# Patient Record
Sex: Female | Born: 2000 | Race: Black or African American | Hispanic: No | Marital: Single | Smoking: Never smoker
Health system: Southern US, Community
[De-identification: ages and names within clinical notes are randomized; demographics above are authoritative.]

## PROBLEM LIST (undated history)

## (undated) DIAGNOSIS — D649 Anemia, unspecified: Secondary | ICD-10-CM

## (undated) HISTORY — PX: NO PAST SURGERIES: SHX2092

---
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
## 2019-08-15 DIAGNOSIS — Z419 Encounter for procedure for purposes other than remedying health state, unspecified: Secondary | ICD-10-CM | POA: Diagnosis not present

## 2019-09-15 DIAGNOSIS — Z419 Encounter for procedure for purposes other than remedying health state, unspecified: Secondary | ICD-10-CM | POA: Diagnosis not present

## 2019-10-16 DIAGNOSIS — Z419 Encounter for procedure for purposes other than remedying health state, unspecified: Secondary | ICD-10-CM | POA: Diagnosis not present

## 2019-11-15 DIAGNOSIS — Z419 Encounter for procedure for purposes other than remedying health state, unspecified: Secondary | ICD-10-CM | POA: Diagnosis not present

## 2019-12-16 DIAGNOSIS — Z419 Encounter for procedure for purposes other than remedying health state, unspecified: Secondary | ICD-10-CM | POA: Diagnosis not present

## 2019-12-17 ENCOUNTER — Emergency Department
Admission: EM | Admit: 2019-12-17 | Discharge: 2019-12-17 | Disposition: A | Discharge status: Home / Self Care | Payer: Medicaid Other | Attending: Emergency Medicine | Admitting: Emergency Medicine

## 2019-12-17 ENCOUNTER — Other Ambulatory Visit: Payer: Self-pay

## 2019-12-17 ENCOUNTER — Emergency Department: Payer: Medicaid Other

## 2019-12-17 DIAGNOSIS — B9689 Other specified bacterial agents as the cause of diseases classified elsewhere: Secondary | ICD-10-CM | POA: Insufficient documentation

## 2019-12-17 DIAGNOSIS — R1033 Periumbilical pain: Secondary | ICD-10-CM | POA: Insufficient documentation

## 2019-12-17 DIAGNOSIS — N3001 Acute cystitis with hematuria: Secondary | ICD-10-CM | POA: Diagnosis not present

## 2019-12-17 DIAGNOSIS — R509 Fever, unspecified: Secondary | ICD-10-CM | POA: Diagnosis not present

## 2019-12-17 DIAGNOSIS — D509 Iron deficiency anemia, unspecified: Secondary | ICD-10-CM | POA: Insufficient documentation

## 2019-12-17 DIAGNOSIS — E876 Hypokalemia: Secondary | ICD-10-CM | POA: Diagnosis not present

## 2019-12-17 DIAGNOSIS — E860 Dehydration: Secondary | ICD-10-CM | POA: Diagnosis not present

## 2019-12-17 DIAGNOSIS — R000 Tachycardia, unspecified: Secondary | ICD-10-CM | POA: Diagnosis not present

## 2019-12-17 DIAGNOSIS — R109 Unspecified abdominal pain: Secondary | ICD-10-CM | POA: Diagnosis not present

## 2019-12-17 LAB — COMPREHENSIVE METABOLIC PANEL
ALT: 11 U/L (ref 0–44)
AST: 17 U/L (ref 15–41)
Albumin: 4.1 g/dL (ref 3.5–5.0)
Alkaline Phosphatase: 53 U/L (ref 38–126)
Anion gap: 10 (ref 5–15)
BUN: 10 mg/dL (ref 6–20)
CO2: 21 mmol/L — ABNORMAL LOW (ref 22–32)
Calcium: 9.1 mg/dL (ref 8.9–10.3)
Chloride: 106 mmol/L (ref 98–111)
Creatinine, Ser: 0.84 mg/dL (ref 0.44–1.00)
GFR, Estimated: >60 mL/min (ref >60)
Glucose, Bld: 106 mg/dL — ABNORMAL HIGH (ref 70–99)
Potassium: 3 mmol/L — ABNORMAL LOW (ref 3.5–5.1)
Sodium: 137 mmol/L (ref 135–145)
Total Bilirubin: 1 mg/dL (ref 0.3–1.2)
Total Protein: 7.7 g/dL (ref 6.5–8.1)

## 2019-12-17 LAB — LIPASE, BLOOD: Lipase: 37 U/L (ref 11–51)

## 2019-12-17 LAB — URINALYSIS, COMPLETE (UACMP) WITH MICROSCOPIC
Bacteria, UA: NONE SEEN
Bilirubin Urine: NEGATIVE
Glucose, UA: NEGATIVE mg/dL
Ketones, ur: 80 mg/dL — AB
Nitrite: NEGATIVE
Protein, ur: 30 mg/dL — AB
Specific Gravity, Urine: >1.046 — ABNORMAL HIGH (ref 1.005–1.030)
WBC, UA: >50 WBC/hpf — ABNORMAL HIGH (ref 0–5)
pH: 5 (ref 5.0–8.0)

## 2019-12-17 LAB — CBC
HCT: 28 % — ABNORMAL LOW (ref 36.0–46.0)
Hemoglobin: 8.5 g/dL — ABNORMAL LOW (ref 12.0–15.0)
MCH: 21.4 pg — ABNORMAL LOW (ref 26.0–34.0)
MCHC: 30.4 g/dL (ref 30.0–36.0)
MCV: 70.5 fL — ABNORMAL LOW (ref 80.0–100.0)
Platelets: 399 10*3/uL (ref 150–400)
RBC: 3.97 MIL/uL (ref 3.87–5.11)
RDW: 17.2 % — ABNORMAL HIGH (ref 11.5–15.5)
WBC: 15.1 10*3/uL — ABNORMAL HIGH (ref 4.0–10.5)
nRBC: 0 % (ref 0.0–0.2)

## 2019-12-17 LAB — POC URINE PREG, ED: Preg Test, Ur: NEGATIVE

## 2019-12-17 MED ORDER — CEPHALEXIN 500 MG PO CAPS: 500.0000 mg | ORAL_CAPSULE | Freq: Four times a day (QID) | ORAL | 0 refills | Status: DC

## 2019-12-17 MED ORDER — LACTATED RINGERS IV BOLUS
1000.0000 mL | Freq: Once | INTRAVENOUS | Status: AC
  Administered 2019-12-17: 1000 mL via INTRAVENOUS

## 2019-12-17 MED ORDER — POTASSIUM CHLORIDE CRYS ER 20 MEQ PO TBCR
40.0000 meq | EXTENDED_RELEASE_TABLET | Freq: Once | ORAL | Status: AC
  Administered 2019-12-17: 40 meq via ORAL
  Filled 2019-12-17: qty 2

## 2019-12-17 MED ORDER — MORPHINE SULFATE (PF) 4 MG/ML IV SOLN
4.0000 mg | Freq: Once | INTRAVENOUS | Status: AC
Start: 2019-12-17 — End: 2019-12-17
  Administered 2019-12-17: 4 mg via INTRAVENOUS
  Filled 2019-12-17: qty 1

## 2019-12-17 MED ORDER — IOHEXOL 300 MG/ML  SOLN
100.0000 mL | Freq: Once | INTRAMUSCULAR | Status: AC | PRN
  Administered 2019-12-17: 100 mL via INTRAVENOUS

## 2019-12-17 MED ORDER — CEPHALEXIN 500 MG PO CAPS
500.0000 mg | ORAL_CAPSULE | Freq: Once | ORAL | Status: AC
  Administered 2019-12-17: 500 mg via ORAL
  Filled 2019-12-17: qty 1

## 2019-12-17 MED ORDER — POTASSIUM CHLORIDE 10 MEQ/100ML IV SOLN
10.0000 meq | INTRAVENOUS | Status: AC
  Administered 2019-12-17 (×2): 10 meq via INTRAVENOUS
  Filled 2019-12-17: qty 100

## 2019-12-17 MED ORDER — CEPHALEXIN 500 MG PO CAPS: 500.0000 mg | ORAL_CAPSULE | Freq: Four times a day (QID) | ORAL | 0 refills | Status: AC

## 2019-12-17 NOTE — ED Triage Notes (Signed)
Pt c/o sharp, burning epigastric pain since last night, denies N/V/D.  States she was had a temp 101 this AM.

## 2019-12-17 NOTE — ED Provider Notes (Signed)
[facility] Emergency Department Provider Note ____________________________________________   First MD Initiated Contact with Patient 12/17/19 1013     (approximate)  I have reviewed the triage vital signs and the nursing notes.  HISTORY  Chief Complaint Abdominal Pain   HPI Kristin Hopkins is a 19 y.o. femalewho presents to the ED for evaluation of abdominal pain.  Chart review indicates no relevant medical history. Patient reports history of iron deficiency anemia, and she does not take her supplementation.  Reports having a PCP.  Patient presents to the ED with about 12 hours of periumbilical abdominal pain.  She reports this pain is constant, burning and sharp in nature, nonradiating.  She reports an associated fever last night of 101 F.  She denies any nausea, vomiting, diarrhea, dysuria, vaginal discharge or bleeding, incontinence.  Denies syncope, chest pain, headache, denies trauma.  History reviewed. No pertinent past medical history.  There are no problems to display for this patient.   History reviewed. No pertinent surgical history.  Prior to Admission medications   Medication Sig Start Date End Date Taking? Authorizing Provider  cephALEXin (KEFLEX) 500 MG capsule Take 1 capsule (500 mg total) by mouth 4 (four) times daily for 5 days. 12/17/19 12/22/19  Delton Prairie, MD    Allergies Patient has no known allergies.  No family history on file.  Social History Social History   Tobacco Use  . Smoking status: Never Smoker  . Smokeless tobacco: Never Used  Substance Use Topics  . Alcohol use: No  . Drug use: No    Review of Systems  Constitutional: Positive fever/chills Eyes: No visual changes. ENT: No sore throat. Cardiovascular: Denies chest pain. Respiratory: Denies shortness of breath. Gastrointestinal: Positive for abdominal pain  No nausea, no vomiting.  No diarrhea.  No constipation. Genitourinary: Negative for  dysuria. Musculoskeletal: Negative for back pain. Skin: Negative for rash. Neurological: Negative for headaches, focal weakness or numbness.  ____________________________________________   PHYSICAL EXAM:  VITAL SIGNS: Vitals:   12/17/19 1345 12/17/19 1400  BP:    Pulse: (!) 109 (!) 111  Resp:    Temp:    SpO2: 100% 100%      Constitutional: Alert and oriented. Well appearing and in no acute distress. Eyes: Conjunctivae are normal. PERRL. EOMI. Head: Atraumatic. Nose: No congestion/rhinnorhea. Mouth/Throat: Mucous membranes are moist.  Oropharynx non-erythematous. Neck: No stridor. No cervical spine tenderness to palpation. Cardiovascular: Tachycardic rate, regular rhythm. Grossly normal heart sounds.  Good peripheral circulation. Respiratory: Normal respiratory effort.  No retractions. Lungs CTAB. Gastrointestinal: Soft , nondistended. No abdominal bruits. No CVA tenderness. Suprapubic and periumbilical tenderness to palpation without peritoneal features.  No CVA tenderness bilaterally. Musculoskeletal: No lower extremity tenderness nor edema.  No joint effusions. No signs of acute trauma. Neurologic:  Normal speech and language. No gross focal neurologic deficits are appreciated. No gait instability noted. Skin:  Skin is warm, dry and intact. No rash noted. Psychiatric: Mood and affect are normal. Speech and behavior are normal.  ____________________________________________   LABS (all labs ordered are listed, but only abnormal results are displayed)  Labs Reviewed  COMPREHENSIVE METABOLIC PANEL - Abnormal; Notable for the following components:      Result Value   Potassium 3.0 (*)    CO2 21 (*)    Glucose, Bld 106 (*)    All other components within normal limits  CBC - Abnormal; Notable for the following components:   WBC 15.1 (*)    Hemoglobin 8.5 (*)  HCT 28.0 (*)    MCV 70.5 (*)    MCH 21.4 (*)    RDW 17.2 (*)    All other components within normal limits   URINALYSIS, COMPLETE (UACMP) WITH MICROSCOPIC - Abnormal; Notable for the following components:   Color, Urine YELLOW (*)    APPearance HAZY (*)    Specific Gravity, Urine >1.046 (*)    Hgb urine dipstick MODERATE (*)    Ketones, ur 80 (*)    Protein, ur 30 (*)    Leukocytes,Ua MODERATE (*)    WBC, UA >50 (*)    All other components within normal limits  URINE CULTURE  LIPASE, BLOOD  POC URINE PREG, ED   ____________________________________________  12 Lead EKG  Sinus rhythm, rate of 130 bpm.  Normal axis and intervals.  No evidence of acute ischemia.  Sinus tachycardia. ____________________________________________  RADIOLOGY  ED MD interpretation: Two view CXR reviewed by me without evidence of acute cardiopulmonary pathology.  Official radiology report(s): DG Chest 2 View  Result Date: 12/17/2019 CLINICAL DATA:  Fever. EXAM: CHEST - 2 VIEW COMPARISON:  None. FINDINGS: The heart size and mediastinal contours are within normal limits. Both lungs are clear. The visualized skeletal structures are unremarkable. IMPRESSION: No active cardiopulmonary disease. Electronically Signed   By: Lupita Raider M.D.   On: 12/17/2019 12:47   CT ABDOMEN PELVIS W CONTRAST  Result Date: 12/17/2019 CLINICAL DATA:  Acute nonlocalized abdominal pain. Isolated periumbilical pain. Burning epigastric pain. EXAM: CT ABDOMEN AND PELVIS WITH CONTRAST TECHNIQUE: Multidetector CT imaging of the abdomen and pelvis was performed using the standard protocol following bolus administration of intravenous contrast. CONTRAST:  OMNIPAQUE IOHEXOL 300 MG/ML  SOLN COMPARISON:  None. FINDINGS: Lower chest: Unremarkable. Hepatobiliary: Tiny hypodensity in the dome of liver is too small to characterize but most likely benign. There is no evidence for gallstones, gallbladder wall thickening, or pericholecystic fluid. No intrahepatic or extrahepatic biliary dilation. Pancreas: No focal mass lesion. No dilatation of the  main duct. No intraparenchymal cyst. No peripancreatic edema. Spleen: No splenomegaly. No focal mass lesion. Adrenals/Urinary Tract: No adrenal nodule or mass. Kidneys unremarkable. No evidence for hydroureter. The urinary bladder appears normal for the degree of distention. Stomach/Bowel: Stomach is unremarkable. No gastric wall thickening. No evidence of outlet obstruction. Duodenum is normally positioned as is the ligament of Treitz. No small bowel wall thickening. No small bowel dilatation. The terminal ileum is normal. The appendix is normal. No gross colonic mass. No colonic wall thickening. No gross colonic mass. No colonic wall thickening. Fluid is seen in the lumen of the right and transverse colon with formed stool noted in the sigmoid colon and rectum. Vascular/Lymphatic: No abdominal aortic aneurysm. No abdominal aortic atherosclerotic calcification. There is no gastrohepatic or hepatoduodenal ligament lymphadenopathy. No retroperitoneal or mesenteric lymphadenopathy. No pelvic sidewall lymphadenopathy. Reproductive: The uterus is unremarkable.  There is no adnexal mass. Other: No intraperitoneal free fluid. Musculoskeletal: No worrisome lytic or sclerotic osseous abnormality. IMPRESSION: No acute findings in the abdomen or pelvis. Specifically, no findings to explain the patient's history of abdominal pain. Electronically Signed   By: Kennith Center M.D.   On: 12/17/2019 11:47    ____________________________________________   PROCEDURES and INTERVENTIONS  Procedure(s) performed (including Critical Care):  Procedures  Medications  potassium chloride 10 mEq in 100 mL IVPB (0 mEq Intravenous Stopped 12/17/19 1254)  cephALEXin (KEFLEX) capsule 500 mg (has no administration in time range)  lactated ringers bolus 1,000 mL (0 mLs  Intravenous Stopped 12/17/19 1143)  morphine 4 MG/ML injection 4 mg (4 mg Intravenous Given 12/17/19 1125)  iohexol (OMNIPAQUE) 300 MG/ML solution 100 mL (100 mLs  Intravenous Contrast Given 12/17/19 1132)  potassium chloride SA (KLOR-CON) CR tablet 40 mEq (40 mEq Oral Given 12/17/19 1259)    ____________________________________________   MDM / ED COURSE  19 year old female presents from home with 12 hours of abdominal pain consistent with acute cystitis and amenable to outpatient management.  Patient tachycardic, but responsive to fluids and otherwise normal vital signs on room air.  Exam with isolated tenderness to her periumbilical and suprapubic abdomen without peritoneal features.  She otherwise has stigmata of dehydration, but is in no acute distress and has no CVA tenderness.  She does self-reported fever at home and she has a leukocytosis, CT does not show evidence of perinephric stranding and she has no fever here to suggest pyelonephritis.  She likely has an ascending infection at this time.  After IV fluids and symptomatic relief, she tolerates p.o. intake and has no barriers to outpatient management.  I provide her with a course of Keflex to treat her infection and we discussed following up with PCP.  We discussed return precautions for the ED.  Patient medically stable for discharge home.  Clinical Course as of Dec 16 1508  Tue Dec 17, 2019  1202 Reassessed.  Patient reports resolution of symptoms.  She indicates that she feels much better after IV fluids.  We discussed CT without evidence of acute pathology.  We discussed iron deficiency anemia and need to follow-up with PCP.  She reports that she knows that she has IDA and she has iron supplements at home, but she never takes them.  I urged her to take these medications and follow-up with her PCP.   [DS]  1418 Reassessed.  Patient reports she feels well.  Mom now in the room and I explained work-up so far with evidence of dehydration, hypokalemia and chronic iron deficiency anemia.  We discussed her CT abdomen/pelvis without acute pathology.  We discussed the possibility of a viral syndrome and the  possibility of outpatient management.  Awaiting urine specimen   [DS]    Clinical Course User Index [DS] Delton Prairie, MD     ____________________________________________   FINAL CLINICAL IMPRESSION(S) / ED DIAGNOSES  Final diagnoses:  Periumbilical abdominal pain  Dehydration  Acute cystitis with hematuria     ED Discharge Orders         Ordered    cephALEXin (KEFLEX) 500 MG capsule  4 times daily,   Status:  Discontinued        12/17/19 1508    cephALEXin (KEFLEX) 500 MG capsule  4 times daily        12/17/19 1509           Riona Lahti   Note:  This document was prepared using Dragon voice recognition software and may include unintentional dictation errors.   Delton Prairie, MD 12/17/19 [phone]

## 2019-12-17 NOTE — Discharge Instructions (Signed)
You were seen in the ED because of your abdominal pain.  You have evidence of a UTI as a likely source of your pain.  You are being discharged a prescription for Keflex antibiotic to treat this UTI.  Please take four times daily for the next 5 days to treat this infection.  Please finish all 20 tablets, even if your symptoms are getting better.  Please take Tylenol and ibuprofen/Advil for your pain.  It is safe to take them together, or to alternate them every few hours.  Take up to 1000mg  of Tylenol at a time, up to 4 times per day.  Do not take more than 4000 mg of Tylenol in 24 hours.  For ibuprofen, take 400-600 mg, 4-5 times per day.  If you develop any worsening symptoms despite these medications, please return to the ED immediately.

## 2019-12-18 LAB — URINE CULTURE

## 2020-01-11 ENCOUNTER — Encounter: Payer: Self-pay | Admitting: Emergency Medicine

## 2020-01-11 ENCOUNTER — Emergency Department
Admission: EM | Admit: 2020-01-11 | Discharge: 2020-01-12 | Disposition: A | Discharge status: Home / Self Care | Payer: Medicaid Other | Attending: Emergency Medicine | Admitting: Emergency Medicine

## 2020-01-11 ENCOUNTER — Other Ambulatory Visit: Payer: Self-pay

## 2020-01-11 DIAGNOSIS — N83201 Unspecified ovarian cyst, right side: Secondary | ICD-10-CM | POA: Diagnosis not present

## 2020-01-11 DIAGNOSIS — N939 Abnormal uterine and vaginal bleeding, unspecified: Secondary | ICD-10-CM | POA: Insufficient documentation

## 2020-01-11 DIAGNOSIS — N938 Other specified abnormal uterine and vaginal bleeding: Secondary | ICD-10-CM | POA: Diagnosis not present

## 2020-01-11 DIAGNOSIS — N83291 Other ovarian cyst, right side: Secondary | ICD-10-CM | POA: Diagnosis not present

## 2020-01-11 DIAGNOSIS — N3090 Cystitis, unspecified without hematuria: Secondary | ICD-10-CM | POA: Diagnosis not present

## 2020-01-11 DIAGNOSIS — E876 Hypokalemia: Secondary | ICD-10-CM | POA: Diagnosis not present

## 2020-01-11 DIAGNOSIS — N390 Urinary tract infection, site not specified: Secondary | ICD-10-CM

## 2020-01-11 HISTORY — DX: Anemia, unspecified: D64.9

## 2020-01-11 LAB — CBC
HCT: 26.9 % — ABNORMAL LOW (ref 36.0–46.0)
Hemoglobin: 8.2 g/dL — ABNORMAL LOW (ref 12.0–15.0)
MCH: 21.3 pg — ABNORMAL LOW (ref 26.0–34.0)
MCHC: 30.5 g/dL (ref 30.0–36.0)
MCV: 69.9 fL — ABNORMAL LOW (ref 80.0–100.0)
Platelets: 345 10*3/uL (ref 150–400)
RBC: 3.85 MIL/uL — ABNORMAL LOW (ref 3.87–5.11)
RDW: 17.1 % — ABNORMAL HIGH (ref 11.5–15.5)
WBC: 7.7 10*3/uL (ref 4.0–10.5)
nRBC: 0 % (ref 0.0–0.2)

## 2020-01-11 LAB — BASIC METABOLIC PANEL
Anion gap: 10 (ref 5–15)
BUN: 10 mg/dL (ref 6–20)
CO2: 21 mmol/L — ABNORMAL LOW (ref 22–32)
Calcium: 8.8 mg/dL — ABNORMAL LOW (ref 8.9–10.3)
Chloride: 106 mmol/L (ref 98–111)
Creatinine, Ser: 0.55 mg/dL (ref 0.44–1.00)
GFR, Estimated: >60 mL/min (ref >60)
Glucose, Bld: 86 mg/dL (ref 70–99)
Potassium: 3 mmol/L — ABNORMAL LOW (ref 3.5–5.1)
Sodium: 137 mmol/L (ref 135–145)

## 2020-01-11 LAB — URINALYSIS, ROUTINE W REFLEX MICROSCOPIC
Bacteria, UA: NONE SEEN
Bilirubin Urine: NEGATIVE
Glucose, UA: NEGATIVE mg/dL
Ketones, ur: 5 mg/dL — AB
Nitrite: NEGATIVE
Protein, ur: NEGATIVE mg/dL
Specific Gravity, Urine: 1.005 (ref 1.005–1.030)
Squamous Epithelial / HPF: NONE SEEN (ref 0–5)
pH: 6 (ref 5.0–8.0)

## 2020-01-11 LAB — POC URINE PREG, ED: Preg Test, Ur: NEGATIVE

## 2020-01-11 NOTE — ED Triage Notes (Addendum)
Pt arrived via POV with reports of vaginal bleeding x 1 month, pt states when the bleeding began a month ago, it was heavier than normal, pt states now it feels like normal period.  Pt changing pad every 4-5 hours. No clots passed.  Pt reports she does have intermittent abd cramping with the bleeding.  Pt does not have PCP and does not have OB-GYN

## 2020-01-11 NOTE — Discharge Instructions (Addendum)
1.  Take Provera 10 mg daily x10 days. 2.  You may take pain medicines as needed (Motrin/Norco #15). 3.  Return to the ER for worsening symptoms, soaking more than 1 pad per hour, persistent vomiting or other concerns.

## 2020-01-11 NOTE — ED Provider Notes (Signed)
[facility] Emergency Department Provider Note   ____________________________________________   First MD Initiated Contact with Patient 01/11/20 2313     (approximate)  I have reviewed the triage vital signs and the nursing notes.   HISTORY  Chief Complaint Vaginal Bleeding    HPI Kristin Hopkins is a 19 y.o. female who presents to the ED from home with a chief complaint of vaginal bleeding.  LMP over 1 month ago.  Patient states she has bleeding since that time.  Initially it was heavier, now feels like a normal..  Associated with intermittent pelvic cramping.  Denies fever, chest pain, shortness of breath, nausea, vomiting or dizziness.  Does not use birth control.     Past Medical History:  Diagnosis Date  . Anemia     There are no problems to display for this patient.   History reviewed. No pertinent surgical history.  Prior to Admission medications   Medication Sig Start Date End Date Taking? Authorizing Provider  HYDROcodone-acetaminophen (NORCO) 5-325 MG tablet Take 1 tablet by mouth every 6 (six) hours as needed for moderate pain. 01/12/20   Irean Hong, MD  ibuprofen (ADVIL) 600 MG tablet Take 1 tablet (600 mg total) by mouth every 8 (eight) hours as needed. 01/12/20   Irean Hong, MD  medroxyPROGESTERone (PROVERA) 10 MG tablet Take 1 tablet (10 mg total) by mouth daily. 01/12/20   Irean Hong, MD    Allergies Patient has no known allergies.  No family history on file.  Social History Social History   Tobacco Use  . Smoking status: Never Smoker  . Smokeless tobacco: Never Used  Vaping Use  . Vaping Use: Never used  Substance Use Topics  . Alcohol use: No  . Drug use: No    Review of Systems  Constitutional: No fever/chills Eyes: No visual changes. ENT: No sore throat. Cardiovascular: Denies chest pain. Respiratory: Denies shortness of breath. Gastrointestinal: No abdominal pain.  No nausea, no vomiting.  No  diarrhea.  No constipation. Genitourinary: Positive for vaginal bleeding.  Negative for dysuria. Musculoskeletal: Negative for back pain. Skin: Negative for rash. Neurological: Negative for headaches, focal weakness or numbness.   ____________________________________________   PHYSICAL EXAM:  VITAL SIGNS: ED Triage Vitals  Enc Vitals Group     BP 01/11/20 1932 123/82     Pulse Rate 01/11/20 1932 91     Resp 01/11/20 1932 16     Temp 01/11/20 1932 98.2 F (36.8 C)     Temp Source 01/11/20 1932 Oral     SpO2 01/11/20 1932 100 %     Weight 01/11/20 1929 170 lb (77.1 kg)     Height 01/11/20 1929 5\' 4"  (1.626 m)     Head Circumference --      Peak Flow --      Pain Score 01/11/20 1929 4     Pain Loc --      Pain Edu? --      Excl. in GC? --     Constitutional: Alert and oriented. Well appearing and in no acute distress. Eyes: Conjunctivae are normal. PERRL. EOMI. Head: Atraumatic. Nose: No congestion/rhinnorhea. Mouth/Throat: Mucous membranes are moist.  Oropharynx non-erythematous. Neck: No stridor.   Cardiovascular: Normal rate, regular rhythm. Grossly normal heart sounds.  Good peripheral circulation. Respiratory: Normal respiratory effort.  No retractions. Lungs CTAB. Gastrointestinal: Soft and nontender to light or deep palpation. No distention. No abdominal bruits. No CVA tenderness. Musculoskeletal: No lower extremity tenderness  nor edema.  No joint effusions. Neurologic:  Normal speech and language. No gross focal neurologic deficits are appreciated. No gait instability. Skin:  Skin is warm, dry and intact. No rash noted. Psychiatric: Mood and affect are normal. Speech and behavior are normal.  ____________________________________________   LABS (all labs ordered are listed, but only abnormal results are displayed)  Labs Reviewed  CBC - Abnormal; Notable for the following components:      Result Value   RBC 3.85 (*)    Hemoglobin 8.2 (*)    HCT 26.9 (*)     MCV 69.9 (*)    MCH 21.3 (*)    RDW 17.1 (*)    All other components within normal limits  BASIC METABOLIC PANEL - Abnormal; Notable for the following components:   Potassium 3.0 (*)    CO2 21 (*)    Calcium 8.8 (*)    All other components within normal limits  URINALYSIS, ROUTINE W REFLEX MICROSCOPIC - Abnormal; Notable for the following components:   Color, Urine STRAW (*)    APPearance CLEAR (*)    Hgb urine dipstick LARGE (*)    Ketones, ur 5 (*)    Leukocytes,Ua TRACE (*)    All other components within normal limits  POC URINE PREG, ED   ____________________________________________  EKG  None ____________________________________________  RADIOLOGY I, Aksel Bencomo J, personally viewed and evaluated these images (plain radiographs) as part of my medical decision making, as well as reviewing the written report by the radiologist.  ED MD interpretation: 7.6 cm simple right ovarian cyst  Official radiology report(s): US PELVIC COMPLETE WITH TRANSVAGINAL  Result Date: 01/12/2020 CLINICAL DATA:  Initial evaluation for acute vaginal bleeding for 1 month. EXAM: TRANSABDOMINAL AND TRANSVAGINAL ULTRASOUND OF PELVIS TECHNIQUE: Both transabdominal and transvaginal ultrasound examinations of the pelvis were performed. Transabdominal technique was performed for global imaging of the pelvis including uterus, ovaries, adnexal regions, and pelvic cul-de-sac. It was necessary to proceed with endovaginal exam following the transabdominal exam to visualize the uterus, endometrium, and ovaries. COMPARISON:  Prior CT from 12/17/2019. FINDINGS: Uterus Measurements: 9.2 x 4.9 x 4.8 cm = volume: 112 mL. Uterus is anteverted. No discrete fibroid or other mass. Endometrium Thickness: 16 mm.  No focal abnormality visualized. Right ovary Measurements: 7.9 x 6.0 x 7.8 cm = volume: 195 mL. 7.6 cm simple anechoic unilocular cyst seen arising from the right ovary. No significant internal complexity. No vascularity  or solid component. Left ovary Measurements: 2.5 x 1.3 x 3.1 cm = volume: 5.3 mL. Normal appearance/no adnexal mass. Other findings Small volume free fluid present within the pelvis. IMPRESSION: 1. Endometrial stripe measures 16 mm in thickness. If bleeding remains unresponsive to hormonal or medical therapy, focal lesion work-up with sonohysterogram should be considered. Endometrial biopsy should also be considered in pre-menopausal patients at high risk for endometrial carcinoma. (Ref: Radiological Reasoning: Algorithmic Workup of Abnormal Vaginal Bleeding with Endovaginal Sonography and Sonohysterography. AJR 2008; 008:Q76-19). 2. 7.6 cm simple right ovarian cyst. Recommend follow-up US in 3-6 months. Note: This recommendation does not apply to premenarchal patients or to those with increased risk (genetic, family history, elevated tumor markers or other high-risk factors) of ovarian cancer. Reference: Radiology 2019 Nov; 293(2):359-371. 3. Small volume free fluid within the pelvis, presumably physiologic. Electronically Signed   By: Rise Mu M.D.   On: 01/12/2020 01:00    ____________________________________________   PROCEDURES  Procedure(s) performed (including Critical Care):  Procedures  Pelvic exam: External exam within normal limits  without rashes, lesions or vesicles.  Speculum exam reveals mild vaginal bleeding.  Cervical os is closed.  Bimanual exam within normal limits.  ____________________________________________   INITIAL IMPRESSION / ASSESSMENT AND PLAN / ED COURSE  As part of my medical decision making, I reviewed the following data within the electronic MEDICAL RECORD NUMBER Nursing notes reviewed and incorporated, Labs reviewed, Old chart reviewed (11/2016 office visit for menorrhagia), Radiograph reviewed and Notes from prior ED visits (visits for menorrhagia, abnormal vaginal bleeding)     19 year old female presenting with vaginal bleeding x1 month, history of  menorrhagia. Differential diagnosis includes, but is not limited to, ovarian cyst, ovarian torsion, acute appendicitis, diverticulitis, urinary tract infection/pyelonephritis, endometriosis, bowel obstruction, colitis, renal colic, gastroenteritis, hernia, fibroids, endometriosis, pregnancy related pain including ectopic pregnancy, etc.  Laboratory results demonstrate stable anemia.  Will proceed with ultrasound pelvis.  Will reassess.  Clinical Course as of Jan 12 507  Wynelle Link Jan 12, 2020  0229 Patient resting in no acute distress.  Updated her on ultrasound results.  Discussed risk/benefits of 10-day course of Depo.  Patient wishes to take this.  Discussed DVT risk on Depo; patient is a non-smoker.  She will follow up with GYN as an outpatient.  Strict return precautions given.  Patient verbalizes understanding and agrees with plan of care.   [JS]    Clinical Course User Index [JS] Irean Hong, MD     ____________________________________________   FINAL CLINICAL IMPRESSION(S) / ED DIAGNOSES  Final diagnoses:  Dysfunctional uterine bleeding  Hypokalemia  Urinary tract infection without hematuria, site unspecified  Cyst of right ovary     ED Discharge Orders         Ordered    medroxyPROGESTERone (PROVERA) 10 MG tablet  Daily        01/12/20 0236    ibuprofen (ADVIL) 600 MG tablet  Every 8 hours PRN        01/12/20 0236    HYDROcodone-acetaminophen (NORCO) 5-325 MG tablet  Every 6 hours PRN        01/12/20 0236          *Please note:  Kajsa Butrum was evaluated in Emergency Department on 01/12/2020 for the symptoms described in the history of present illness. She was evaluated in the context of the global COVID-19 pandemic, which necessitated consideration that the patient might be at risk for infection with the SARS-CoV-2 virus that causes COVID-19. Institutional protocols and algorithms that pertain to the evaluation of patients at risk for COVID-19 are in a state of  rapid change based on information released by regulatory bodies including the CDC and federal and state organizations. These policies and algorithms were followed during the patient's care in the ED.  Some ED evaluations and interventions may be delayed as a result of limited staffing during and the pandemic.*   Note:  This document was prepared using Dragon voice recognition software and may include unintentional dictation errors.   Irean Hong, MD 01/12/20 [phone]

## 2020-01-12 ENCOUNTER — Emergency Department: Payer: Medicaid Other

## 2020-01-12 DIAGNOSIS — N83291 Other ovarian cyst, right side: Secondary | ICD-10-CM | POA: Diagnosis not present

## 2020-01-12 MED ORDER — HYDROCODONE-ACETAMINOPHEN 5-325 MG PO TABS: 1.0000 | ORAL_TABLET | Freq: Four times a day (QID) | ORAL | 0 refills | Status: DC | PRN

## 2020-01-12 MED ORDER — FOSFOMYCIN TROMETHAMINE 3 G PO PACK
3.0000 g | PACK | Freq: Once | ORAL | Status: AC
  Administered 2020-01-12: 3 g via ORAL
  Filled 2020-01-12: qty 3

## 2020-01-12 MED ORDER — IBUPROFEN 600 MG PO TABS: 600.0000 mg | ORAL_TABLET | Freq: Three times a day (TID) | ORAL | 0 refills | Status: DC | PRN

## 2020-01-12 MED ORDER — POTASSIUM CHLORIDE 20 MEQ PO PACK
40.0000 meq | PACK | Freq: Once | ORAL | Status: AC
  Administered 2020-01-12: 40 meq via ORAL
  Filled 2020-01-12: qty 2

## 2020-01-12 MED ORDER — MEDROXYPROGESTERONE ACETATE 10 MG PO TABS: 10.0000 mg | ORAL_TABLET | Freq: Every day | ORAL | 0 refills | Status: DC

## 2020-01-15 DIAGNOSIS — Z419 Encounter for procedure for purposes other than remedying health state, unspecified: Secondary | ICD-10-CM | POA: Diagnosis not present

## 2020-01-27 ENCOUNTER — Ambulatory Visit (INDEPENDENT_AMBULATORY_CARE_PROVIDER_SITE_OTHER): Payer: Medicaid Other | Admitting: Certified Nurse Midwife

## 2020-01-27 ENCOUNTER — Other Ambulatory Visit: Payer: Self-pay

## 2020-01-27 ENCOUNTER — Encounter: Payer: Self-pay | Admitting: Certified Nurse Midwife

## 2020-01-27 VITALS — BP 126/74 | HR 81 | Ht 64.0 in | Wt 162.0 lb

## 2020-01-27 DIAGNOSIS — N939 Abnormal uterine and vaginal bleeding, unspecified: Secondary | ICD-10-CM | POA: Insufficient documentation

## 2020-01-27 DIAGNOSIS — Z23 Encounter for immunization: Secondary | ICD-10-CM

## 2020-01-27 DIAGNOSIS — N83201 Unspecified ovarian cyst, right side: Secondary | ICD-10-CM | POA: Insufficient documentation

## 2020-01-27 MED ORDER — TETANUS-DIPHTH-ACELL PERTUSSIS 5-2.5-18.5 LF-MCG/0.5 IM SUSY
0.5000 mL | PREFILLED_SYRINGE | Freq: Once | INTRAMUSCULAR | Status: AC
  Administered 2020-01-27: 11:00:00 0.5 mL via INTRAMUSCULAR

## 2020-01-27 NOTE — Progress Notes (Signed)
GYN ENCOUNTER NOTE  Subjective:       Kristin Hopkins is a 19 y.o. No obstetric history on file. female is here for gynecologic evaluation of the following issues:  1. Abnormal uterine bleeding. Pt was seen in the ED  11/27 and presents today for follow up. She was started on provera 10 mg daily.   U/s showed a simple cyst 7.6 cm on right ovary.   She  went to ED for bleeding x 1 month. Stated her LMP was Oct. And that she had been bleeding since then.   Gynecologic History Patient's last menstrual period was 12/16/2019 (approximate). Contraception: condoms Last Pap: n/a  Last mammogram: n/a  Obstetric History OB History  Gravida Para Term Preterm AB Living  0 0 0 0 0 0  SAB IAB Ectopic Multiple Live Births  0 0 0 0 0    Past Medical History:  Diagnosis Date  . Anemia     No past surgical history on file.  Current Outpatient Medications on File Prior to Visit  Medication Sig Dispense Refill  . ibuprofen (ADVIL) 600 MG tablet Take 1 tablet (600 mg total) by mouth every 8 (eight) hours as needed. 15 tablet 0   No current facility-administered medications on file prior to visit.    No Known Allergies  Social History   Socioeconomic History  . Marital status: Single    Spouse name: Not on file  . Number of children: Not on file  . Years of education: Not on file  . Highest education level: Not on file  Occupational History  . Not on file  Tobacco Use  . Smoking status: Never Smoker  . Smokeless tobacco: Never Used  Vaping Use  . Vaping Use: Never used  Substance and Sexual Activity  . Alcohol use: No  . Drug use: No  . Sexual activity: Yes    Birth control/protection: Condom  Other Topics Concern  . Not on file  Social History Narrative  . Not on file   Social Determinants of Health   Financial Resource Strain: Not on file  Food Insecurity: Not on file  Transportation Needs: Not on file  Physical Activity: Not on file  Stress: Not on file  Social  Connections: Not on file  Intimate Partner Violence: Not on file    Family History  Problem Relation Age of Onset  . Diabetes Mother     The following portions of the patient's history were reviewed and updated as appropriate: allergies, current medications, past family history, past medical history, past social history, past surgical history and problem list.  Review of Systems Review of Systems - Negative except as mentioned in HPI Review of Systems - General ROS: negative for - chills, fatigue, fever, hot flashes, malaise or night sweats Hematological and Lymphatic ROS: negative for - bleeding problems or swollen lymph nodes Gastrointestinal ROS: negative for - abdominal pain, blood in stools, change in bowel habits and nausea/vomiting Musculoskeletal ROS: negative for - joint pain, muscle pain or muscular weakness Genito-Urinary ROS: negative for - change in menstrual cycle, dysmenorrhea, dyspareunia, dysuria, genital discharge, genital ulcers, hematuria, incontinence, irregular/heavy menses, nocturia or pelvic pain.   Objective:   BP 126/74   Pulse 81   Ht 5\' 4"  (1.626 m)   Wt 162 lb (73.5 kg)   LMP 12/16/2019 (Approximate)   BMI 27.81 kg/m  CONSTITUTIONAL: Well-developed, well-nourished female in no acute distress.  HENT:  Normocephalic, atraumatic.  NECK: Normal range of motion, supple, no  masses.  Normal thyroid.  SKIN: Skin is warm and dry. No rash noted. Not diaphoretic. No erythema. No pallor. NEUROLGIC: Alert and oriented to person, place, and time.  PSYCHIATRIC: Normal mood and affect. Normal behavior. Normal judgment and thought content. CARDIOVASCULAR:Not Examined RESPIRATORY: Not Examined BREASTS: Not Examined ABDOMEN: Soft, non distended; Non tender.  No Organomegaly. PELVIC:not indicated  MUSCULOSKELETAL: Normal range of motion. No tenderness.  No cyanosis, clubbing, or edema.     Assessment:   Abnormal uterine bleeding Right Ovarian cyst   Plan:    Discussed abnormal uterine bleeding. Bleeding stopped 2 wks ago with provera treatment. Reviewed treatment options. Pt would like to wait for her next cycle to see if the abnormal bleeding occurs again. Discussed ovarian cysts, dx, treatment options. Recommend follow up u/s in February to evaluated for changes. Red flag symptoms discussed. Follow up for annual in June , u/s in [place] or PRN.   Doreene Burke, CNM

## 2020-01-27 NOTE — Patient Instructions (Signed)
Ovarian Cyst     An ovarian cyst is a fluid-filled sac that forms on an ovary. The ovaries are small organs that produce eggs in women. Various types of cysts can form on the ovaries. Some may cause symptoms and require treatment. Most ovarian cysts go away on their own, are not cancerous (are benign), and do not cause problems. Common types of ovarian cysts include:  Functional (follicle) cysts. ? Occur during the menstrual cycle, and usually go away with the next menstrual cycle if you do not get pregnant. ? Usually cause no symptoms.  Endometriomas. ? Are cysts that form from the tissue that lines the uterus (endometrium). ? Are sometimes called "chocolate cysts" because they become filled with blood that turns brown. ? Can cause pain in the lower abdomen during intercourse and during your period.  Cystadenoma cysts. ? Develop from cells on the outside surface of the ovary. ? Can get very large and cause lower abdomen pain and pain with intercourse. ? Can cause severe pain if they twist or break open (rupture).  Dermoid cysts. ? Are sometimes found in both ovaries. ? May contain different kinds of body tissue, such as skin, teeth, hair, or cartilage. ? Usually do not cause symptoms unless they get very big.  Theca lutein cysts. ? Occur when too much of a certain hormone (human chorionic gonadotropin) is produced and overstimulates the ovaries to produce an egg. ? Are most common after having procedures used to assist with the conception of a baby (in vitro fertilization). What are the causes? Ovarian cysts may be caused by:  Ovarian hyperstimulation syndrome. This is a condition that can develop from taking fertility medicines. It causes multiple large ovarian cysts to form.  Polycystic ovarian syndrome (PCOS). This is a common hormonal disorder that can cause ovarian cysts, as well as problems with your period or fertility. What increases the risk? The following factors may  make you more likely to develop ovarian cysts:  Being overweight or obese.  Taking fertility medicines.  Taking certain forms of hormonal birth control.  Smoking. What are the signs or symptoms? Many ovarian cysts do not cause symptoms. If symptoms are present, they may include:  Pelvic pain or pressure.  Pain in the lower abdomen.  Pain during sex.  Abdominal swelling.  Abnormal menstrual periods.  Increasing pain with menstrual periods. How is this diagnosed? These cysts are commonly found during a routine pelvic exam. You may have tests to find out more about the cyst, such as:  Ultrasound.  X-ray of the pelvis.  CT scan.  MRI.  Blood tests. How is this treated? Many ovarian cysts go away on their own without treatment. Your health care provider may want to check your cyst regularly for 2-3 months to see if it changes. If you are in menopause, it is especially important to have your cyst monitored closely because menopausal women have a higher rate of ovarian cancer. When treatment is needed, it may include:  Medicines to help relieve pain.  A procedure to drain the cyst (aspiration).  Surgery to remove the whole cyst.  Hormone treatment or birth control pills. These methods are sometimes used to help dissolve a cyst. Follow these instructions at home:  Take over-the-counter and prescription medicines only as told by your health care provider.  Do not drive or use heavy machinery while taking prescription pain medicine.  Get regular pelvic exams and Pap tests as often as told by your health care provider.    Return to your normal activities as told by your health care provider. Ask your health care provider what activities are safe for you.  Do not use any products that contain nicotine or tobacco, such as cigarettes and e-cigarettes. If you need help quitting, ask your health care provider.  Keep all follow-up visits as told by your health care provider.  This is important. Contact a health care provider if:  Your periods are late, irregular, or painful, or they stop.  You have pelvic pain that does not go away.  You have pressure on your bladder or trouble emptying your bladder completely.  You have pain during sex.  You have any of the following in your abdomen: ? A feeling of fullness. ? Pressure. ? Discomfort. ? Pain that does not go away. ? Swelling.  You feel generally ill.  You become constipated.  You lose your appetite.  You develop severe acne.  You start to have more body hair and facial hair.  You are gaining weight or losing weight without changing your exercise and eating habits.  You think you may be pregnant. Get help right away if:  You have abdominal pain that is severe or gets worse.  You cannot eat or drink without vomiting.  You suddenly develop a fever.  Your menstrual period is much heavier than usual. This information is not intended to replace advice given to you by your health care provider. Make sure you discuss any questions you have with your health care provider. Document Revised: 05/01/2017 Document Reviewed: 07/05/2015 Elsevier Patient Education  2020 [employer]  

## 2020-02-15 DIAGNOSIS — Z419 Encounter for procedure for purposes other than remedying health state, unspecified: Secondary | ICD-10-CM | POA: Diagnosis not present

## 2020-03-17 DIAGNOSIS — Z419 Encounter for procedure for purposes other than remedying health state, unspecified: Secondary | ICD-10-CM | POA: Diagnosis not present

## 2020-03-26 ENCOUNTER — Other Ambulatory Visit: Payer: Self-pay

## 2020-03-26 ENCOUNTER — Ambulatory Visit (INDEPENDENT_AMBULATORY_CARE_PROVIDER_SITE_OTHER): Payer: Medicaid Other

## 2020-03-26 DIAGNOSIS — N83201 Unspecified ovarian cyst, right side: Secondary | ICD-10-CM | POA: Diagnosis not present

## 2020-04-14 DIAGNOSIS — Z419 Encounter for procedure for purposes other than remedying health state, unspecified: Secondary | ICD-10-CM | POA: Diagnosis not present

## 2020-04-17 DIAGNOSIS — N72 Inflammatory disease of cervix uteri: Secondary | ICD-10-CM | POA: Diagnosis not present

## 2020-05-15 DIAGNOSIS — Z419 Encounter for procedure for purposes other than remedying health state, unspecified: Secondary | ICD-10-CM | POA: Diagnosis not present

## 2020-06-14 DIAGNOSIS — Z419 Encounter for procedure for purposes other than remedying health state, unspecified: Secondary | ICD-10-CM | POA: Diagnosis not present

## 2020-07-15 DIAGNOSIS — Z419 Encounter for procedure for purposes other than remedying health state, unspecified: Secondary | ICD-10-CM | POA: Diagnosis not present

## 2020-08-05 ENCOUNTER — Other Ambulatory Visit (HOSPITAL_COMMUNITY)
Admission: RE | Admit: 2020-08-05 | Discharge: 2020-08-05 | Disposition: A | Discharge status: Home / Self Care | Payer: Medicaid Other | Source: Ambulatory Visit | Attending: Certified Nurse Midwife | Admitting: Certified Nurse Midwife

## 2020-08-05 ENCOUNTER — Ambulatory Visit (INDEPENDENT_AMBULATORY_CARE_PROVIDER_SITE_OTHER): Payer: Medicaid Other | Admitting: Certified Nurse Midwife

## 2020-08-05 ENCOUNTER — Encounter: Payer: Self-pay | Admitting: Certified Nurse Midwife

## 2020-08-05 ENCOUNTER — Other Ambulatory Visit: Payer: Self-pay

## 2020-08-05 VITALS — BP 128/84 | HR 86 | Ht 64.0 in | Wt 156.0 lb

## 2020-08-05 DIAGNOSIS — Z124 Encounter for screening for malignant neoplasm of cervix: Secondary | ICD-10-CM | POA: Diagnosis not present

## 2020-08-05 DIAGNOSIS — Z01419 Encounter for gynecological examination (general) (routine) without abnormal findings: Secondary | ICD-10-CM | POA: Diagnosis not present

## 2020-08-05 DIAGNOSIS — Z113 Encounter for screening for infections with a predominantly sexual mode of transmission: Secondary | ICD-10-CM

## 2020-08-05 DIAGNOSIS — D508 Other iron deficiency anemias: Secondary | ICD-10-CM

## 2020-08-05 DIAGNOSIS — Z13220 Encounter for screening for lipoid disorders: Secondary | ICD-10-CM

## 2020-08-05 NOTE — Progress Notes (Signed)
Pt presents for annual w/ pap. No concerns and does not currently want to discuss contraceptive options.

## 2020-08-05 NOTE — Progress Notes (Signed)
GYNECOLOGY ANNUAL PREVENTATIVE CARE ENCOUNTER NOTE  History:     Kristin Hopkins is a 20 y.o. G0P0000 female here for a routine annual gynecologic exam.  Current complaints: none.   Denies abnormal vaginal bleeding, discharge, pelvic pain, problems with intercourse or other gynecologic concerns.     Social Relationship: female partner Living: alone Work:[employer]  Exercise: few times a month Smoke/Alcohol/drug use: denies use  Gynecologic History Patient's last menstrual period was 07/25/2020 (exact date). Contraception: condoms Last Pap: has not had.  Last mammogram: n/a   Upstream - 08/05/20 1009       Pregnancy Intention Screening   Does the patient want to become pregnant in the next year? No    Does the patient's partner want to become pregnant in the next year? N/A    Would the patient like to discuss contraceptive options today? No      Contraception Wrap Up   Current Method Female Condom    End Method Female Condom    Contraception Counseling Provided No            The pregnancy intention screening data noted above was reviewed. Potential methods of contraception were discussed. The patient elected to proceed with Female Condom.    Obstetric History OB History  Gravida Para Term Preterm AB Living  0 0 0 0 0 0  SAB IAB Ectopic Multiple Live Births  0 0 0 0 0    Past Medical History:  Diagnosis Date   Anemia     No past surgical history on file.  Current Outpatient Medications on File Prior to Visit  Medication Sig Dispense Refill   ibuprofen (ADVIL) 600 MG tablet Take 1 tablet (600 mg total) by mouth every 8 (eight) hours as needed. 15 tablet 0   No current facility-administered medications on file prior to visit.    No Known Allergies  Social History:  reports that she has never smoked. She has never used smokeless tobacco. She reports that she does not drink alcohol and does not use drugs.  Family History  Problem Relation Age of  Onset   Diabetes Mother     The following portions of the patient's history were reviewed and updated as appropriate: allergies, current medications, past family history, past medical history, past social history, past surgical history and problem list.  Review of Systems Pertinent items noted in HPI and remainder of comprehensive ROS otherwise negative.  Physical Exam:  BP 128/84   Pulse 86   Ht 5\' 4"  (1.626 m)   Wt 156 lb (70.8 kg)   LMP 07/25/2020 (Exact Date)   BMI 26.78 kg/m  CONSTITUTIONAL: Well-developed, well-nourished female in no acute distress.  HENT:  Normocephalic, atraumatic, External right and left ear normal. Oropharynx is clear and moist EYES: Conjunctivae and EOM are normal. Pupils are equal, round, and reactive to light. No scleral icterus.  NECK: Normal range of motion, supple, no masses.  Normal thyroid.  SKIN: Skin is warm and dry. No rash noted. Not diaphoretic. No erythema. No pallor. MUSCULOSKELETAL: Normal range of motion. No tenderness.  No cyanosis, clubbing, or edema.  2+ distal pulses. NEUROLOGIC: Alert and oriented to person, place, and time. Normal reflexes, muscle tone coordination.  PSYCHIATRIC: Normal mood and affect. Normal behavior. Normal judgment and thought content. CARDIOVASCULAR: Normal heart rate noted, regular rhythm RESPIRATORY: Clear to auscultation bilaterally. Effort and breath sounds normal, no problems with respiration noted. BREASTS: Symmetric in size. No masses, tenderness, skin changes, nipple  drainage, or lymphadenopathy bilaterally.  ABDOMEN: Soft, no distention noted.  No tenderness, rebound or guarding.  PELVIC: Normal appearing external genitalia and urethral meatus; normal appearing vaginal mucosa and cervix.  discharge noted- yellow in color.  Pap smear obtained.  Normal uterine size, no other palpable masses, no uterine or adnexal tenderness.  .   Assessment and Plan:    1. Women's annual routine gynecological  examination   Pap: Will follow up results of pap smear and manage accordingly. Mammogram : n/a Labs: cbc, vitamin b 12, iron, HIV, Hep c, Hep B, RPR, HSV, lipid Refills: none Referral: none Routine preventative health maintenance measures emphasized. Please refer to After Visit Summary for other counseling recommendations.      Doreene Burke, CNM [facility],  [facility]

## 2020-08-06 LAB — LIPID PANEL
Chol/HDL Ratio: 2.1 ratio (ref 0.0–4.4)
Cholesterol, Total: 173 mg/dL (ref 100–199)
HDL: 82 mg/dL (ref >39)
LDL Chol Calc (NIH): 80 mg/dL (ref 0–99)
Triglycerides: 57 mg/dL (ref 0–149)
VLDL Cholesterol Cal: 11 mg/dL (ref 5–40)

## 2020-08-06 LAB — CBC
Hematocrit: 31 % — ABNORMAL LOW (ref 34.0–46.6)
Hemoglobin: 8.6 g/dL — ABNORMAL LOW (ref 11.1–15.9)
MCH: 19.3 pg — ABNORMAL LOW (ref 26.6–33.0)
MCHC: 27.7 g/dL — ABNORMAL LOW (ref 31.5–35.7)
MCV: 70 fL — ABNORMAL LOW (ref 79–97)
Platelets: 437 10*3/uL (ref 150–450)
RBC: 4.46 x10E6/uL (ref 3.77–5.28)
RDW: 17.3 % — ABNORMAL HIGH (ref 11.7–15.4)
WBC: 4 10*3/uL (ref 3.4–10.8)

## 2020-08-06 LAB — HSV(HERPES SIMPLEX VRS) I + II AB-IGG
HSV 1 Glycoprotein G Ab, IgG: 36.3 index — ABNORMAL HIGH (ref 0.00–0.90)
HSV 2 IgG, Type Spec: <0.91 index (ref 0.00–0.90)

## 2020-08-06 LAB — HEPATITIS C ANTIBODY: Hep C Virus Ab: 0.2 s/co ratio (ref 0.0–0.9)

## 2020-08-06 LAB — IRON,TIBC AND FERRITIN PANEL
Ferritin: 4 ng/mL — ABNORMAL LOW (ref 15–150)
Iron Saturation: 7 % — CL (ref 15–55)
Iron: 25 ug/dL — ABNORMAL LOW (ref 27–159)
Total Iron Binding Capacity: 384 ug/dL (ref 250–450)
UIBC: 359 ug/dL (ref 131–425)

## 2020-08-06 LAB — CYTOLOGY - PAP
Chlamydia: NEGATIVE
Comment: NEGATIVE
Comment: NEGATIVE
Comment: NORMAL
Diagnosis: NEGATIVE
Neisseria Gonorrhea: NEGATIVE
Trichomonas: NEGATIVE

## 2020-08-06 LAB — RPR: RPR Ser Ql: NONREACTIVE

## 2020-08-06 LAB — HEPATITIS B SURFACE ANTIGEN: Hepatitis B Surface Ag: NEGATIVE

## 2020-08-06 LAB — HIV ANTIBODY (ROUTINE TESTING W REFLEX): HIV Screen 4th Generation wRfx: NONREACTIVE

## 2020-08-06 LAB — VITAMIN B12: Vitamin B-12: 554 pg/mL (ref 232–1245)

## 2020-08-14 DIAGNOSIS — Z419 Encounter for procedure for purposes other than remedying health state, unspecified: Secondary | ICD-10-CM | POA: Diagnosis not present

## 2020-09-14 DIAGNOSIS — Z419 Encounter for procedure for purposes other than remedying health state, unspecified: Secondary | ICD-10-CM | POA: Diagnosis not present

## 2020-10-15 DIAGNOSIS — Z419 Encounter for procedure for purposes other than remedying health state, unspecified: Secondary | ICD-10-CM | POA: Diagnosis not present

## 2020-11-14 DIAGNOSIS — Z419 Encounter for procedure for purposes other than remedying health state, unspecified: Secondary | ICD-10-CM | POA: Diagnosis not present

## 2020-12-08 ENCOUNTER — Emergency Department: Payer: Medicaid Other

## 2020-12-08 ENCOUNTER — Emergency Department
Admission: EM | Admit: 2020-12-08 | Discharge: 2020-12-08 | Disposition: A | Discharge status: Home / Self Care | Payer: Medicaid Other | Attending: Emergency Medicine | Admitting: Emergency Medicine

## 2020-12-08 ENCOUNTER — Encounter: Payer: Self-pay | Admitting: Radiology

## 2020-12-08 ENCOUNTER — Other Ambulatory Visit: Payer: Self-pay

## 2020-12-08 DIAGNOSIS — J029 Acute pharyngitis, unspecified: Secondary | ICD-10-CM | POA: Diagnosis present

## 2020-12-08 DIAGNOSIS — J352 Hypertrophy of adenoids: Secondary | ICD-10-CM | POA: Diagnosis not present

## 2020-12-08 DIAGNOSIS — J351 Hypertrophy of tonsils: Secondary | ICD-10-CM | POA: Diagnosis not present

## 2020-12-08 DIAGNOSIS — J039 Acute tonsillitis, unspecified: Secondary | ICD-10-CM | POA: Insufficient documentation

## 2020-12-08 DIAGNOSIS — R590 Localized enlarged lymph nodes: Secondary | ICD-10-CM | POA: Diagnosis not present

## 2020-12-08 DIAGNOSIS — J384 Edema of larynx: Secondary | ICD-10-CM | POA: Diagnosis not present

## 2020-12-08 LAB — CBC WITH DIFFERENTIAL/PLATELET
Abs Immature Granulocytes: 0.04 10*3/uL (ref 0.00–0.07)
Basophils Absolute: 0 10*3/uL (ref 0.0–0.1)
Basophils Relative: 0 %
Eosinophils Absolute: 0 10*3/uL (ref 0.0–0.5)
Eosinophils Relative: 0 %
HCT: 30.1 % — ABNORMAL LOW (ref 36.0–46.0)
Hemoglobin: 9.2 g/dL — ABNORMAL LOW (ref 12.0–15.0)
Immature Granulocytes: 0 %
Lymphocytes Relative: 16 %
Lymphs Abs: 1.9 10*3/uL (ref 0.7–4.0)
MCH: 20.5 pg — ABNORMAL LOW (ref 26.0–34.0)
MCHC: 30.6 g/dL (ref 30.0–36.0)
MCV: 67.2 fL — ABNORMAL LOW (ref 80.0–100.0)
Monocytes Absolute: 1.1 10*3/uL — ABNORMAL HIGH (ref 0.1–1.0)
Monocytes Relative: 9 %
Neutro Abs: 8.8 10*3/uL — ABNORMAL HIGH (ref 1.7–7.7)
Neutrophils Relative %: 75 %
Platelets: 365 10*3/uL (ref 150–400)
RBC: 4.48 MIL/uL (ref 3.87–5.11)
RDW: 18.5 % — ABNORMAL HIGH (ref 11.5–15.5)
WBC: 11.8 10*3/uL — ABNORMAL HIGH (ref 4.0–10.5)
nRBC: 0 % (ref 0.0–0.2)

## 2020-12-08 LAB — COMPREHENSIVE METABOLIC PANEL
ALT: 13 U/L (ref 0–44)
AST: 18 U/L (ref 15–41)
Albumin: 4.3 g/dL (ref 3.5–5.0)
Alkaline Phosphatase: 58 U/L (ref 38–126)
Anion gap: 10 (ref 5–15)
BUN: 11 mg/dL (ref 6–20)
CO2: 22 mmol/L (ref 22–32)
Calcium: 9.4 mg/dL (ref 8.9–10.3)
Chloride: 103 mmol/L (ref 98–111)
Creatinine, Ser: 0.72 mg/dL (ref 0.44–1.00)
GFR, Estimated: >60 mL/min (ref >60)
Glucose, Bld: 81 mg/dL (ref 70–99)
Potassium: 3.5 mmol/L (ref 3.5–5.1)
Sodium: 135 mmol/L (ref 135–145)
Total Bilirubin: 0.9 mg/dL (ref 0.3–1.2)
Total Protein: 8.7 g/dL — ABNORMAL HIGH (ref 6.5–8.1)

## 2020-12-08 LAB — LACTIC ACID, PLASMA: Lactic Acid, Venous: 1.1 mmol/L (ref 0.5–1.9)

## 2020-12-08 LAB — GROUP A STREP BY PCR: Group A Strep by PCR: NOT DETECTED

## 2020-12-08 MED ORDER — IOHEXOL 300 MG/ML  SOLN
75.0000 mL | Freq: Once | INTRAMUSCULAR | Status: AC | PRN
  Administered 2020-12-08: 75 mL via INTRAVENOUS
  Filled 2020-12-08: qty 75

## 2020-12-08 MED ORDER — LIDOCAINE VISCOUS HCL 2 % MT SOLN
15.0000 mL | Freq: Once | OROMUCOSAL | Status: AC
  Administered 2020-12-08: 15 mL via OROMUCOSAL
  Filled 2020-12-08: qty 15

## 2020-12-08 MED ORDER — DEXAMETHASONE SODIUM PHOSPHATE 10 MG/ML IJ SOLN
10.0000 mg | Freq: Once | INTRAMUSCULAR | Status: AC
  Administered 2020-12-08: 10 mg via INTRAVENOUS
  Filled 2020-12-08: qty 1

## 2020-12-08 MED ORDER — AMOXICILLIN 875 MG PO TABS: 875.0000 mg | ORAL_TABLET | Freq: Two times a day (BID) | ORAL | 0 refills | Status: DC

## 2020-12-08 MED ORDER — MAGIC MOUTHWASH W/LIDOCAINE: 5.0000 mL | Freq: Four times a day (QID) | ORAL | 0 refills | Status: DC

## 2020-12-08 MED ORDER — PREDNISONE 50 MG PO TABS: 50.0000 mg | ORAL_TABLET | Freq: Every day | ORAL | 0 refills | Status: DC

## 2020-12-08 MED ORDER — SODIUM CHLORIDE 0.9 % IV BOLUS
1000.0000 mL | Freq: Once | INTRAVENOUS | Status: AC
  Administered 2020-12-08: 1000 mL via INTRAVENOUS

## 2020-12-08 MED ORDER — SODIUM CHLORIDE 0.9 % IV SOLN
3.0000 g | Freq: Once | INTRAVENOUS | Status: AC
  Administered 2020-12-08: 3 g via INTRAVENOUS
  Filled 2020-12-08: qty 8

## 2020-12-08 NOTE — ED Notes (Signed)
Pt states that she started having a sore throat on Saturday, pt states that she went to the clinic who told her to come her today because they felt she needed treatment that the clinic couldn't provide, pt has garbled speech and states that it is difficult to swallow due to the pain and swelling in his throat

## 2020-12-08 NOTE — ED Provider Notes (Signed)
[facility] Emergency Department Provider Note  ____________________________________________  Time seen: Approximately 12:17 PM  I have reviewed the triage vital signs and the nursing notes.   HISTORY  Chief Complaint Sore Throat    HPI Kristin Hopkins is a 20 y.o. female who presents emergency department complaining of sore throat, voice changes, difficulty swallowing.  Patient has had symptoms x3 days.  Progressively worsening.  No reported fever and was afebrile here on arrival.  No congestion, no cough.  Patient denies any sick contacts.  Patient denies any headaches, visual changes, chest pain, shortness of breath, abdominal pain.  No nausea, vomiting, diarrhea.       Past Medical History:  Diagnosis Date   Anemia     Patient Active Problem List   Diagnosis Date Noted   Abnormal uterine bleeding 01/27/2020   Right ovarian cyst 01/27/2020    No past surgical history on file.  Prior to Admission medications   Medication Sig Start Date End Date Taking? Authorizing Provider  amoxicillin (AMOXIL) 875 MG tablet Take 1 tablet (875 mg total) by mouth 2 (two) times daily. 12/08/20  Yes Akiya Morr, Delorise Royals, PA-C  magic mouthwash w/lidocaine SOLN Take 5 mLs by mouth 4 (four) times daily. 12/08/20  Yes Aalaiyah Yassin, Delorise Royals, PA-C  predniSONE (DELTASONE) 50 MG tablet Take 1 tablet (50 mg total) by mouth daily with breakfast. 12/08/20  Yes Treacy Holcomb, Delorise Royals, PA-C  ibuprofen (ADVIL) 600 MG tablet Take 1 tablet (600 mg total) by mouth every 8 (eight) hours as needed. 01/12/20   Irean Hong, MD    Allergies Patient has no known allergies.  Family History  Problem Relation Age of Onset   Diabetes Mother     Social History Social History   Tobacco Use   Smoking status: Never   Smokeless tobacco: Never  Vaping Use   Vaping Use: Never used  Substance Use Topics   Alcohol use: No   Drug use: No     Review of Systems  Constitutional: No  fever/chills Eyes: No visual changes. No discharge ENT: Positive for sore throat, voice changes, difficulty swallowing Cardiovascular: no chest pain. Respiratory: no cough. No SOB. Gastrointestinal: No abdominal pain.  No nausea, no vomiting.  No diarrhea.  No constipation. Musculoskeletal: Negative for musculoskeletal pain. Skin: Negative for rash, abrasions, lacerations, ecchymosis. Neurological: Negative for headaches, focal weakness or numbness.  10 System ROS otherwise negative.  ____________________________________________   PHYSICAL EXAM:  VITAL SIGNS: ED Triage Vitals  Enc Vitals Group     BP 12/08/20 1106 (!) 147/86     Pulse Rate 12/08/20 1106 (!) 123     Resp 12/08/20 1106 18     Temp 12/08/20 1106 99.6 F (37.6 C)     Temp Source 12/08/20 1106 Oral     SpO2 12/08/20 1106 100 %     Weight 12/08/20 1107 154 lb (69.9 kg)     Height 12/08/20 1107 5\' 5"  (1.651 m)     Head Circumference --      Peak Flow --      Pain Score 12/08/20 1107 9     Pain Loc --      Pain Edu? --      Excl. in GC? --      Constitutional: Alert and oriented. Well appearing and in no acute distress. Eyes: Conjunctivae are normal. PERRL. EOMI. Head: Atraumatic. ENT:      Ears:       Nose: No congestion/rhinnorhea.  Mouth/Throat: Mucous membranes are moist.  Oropharynx is grossly edematous.  Patient has significantly enlarged tonsils, uvular deviation.  No exudates were clearly identified. Neck: No stridor.  Mild left anterior neck tenderness.  There is no erythema or edema of the anterior neck.  No erythema or edema of the submandibular region identified.  Hematological/Lymphatic/Immunilogical: Diffuse anterior cervical lymphadenopathy. Cardiovascular: Normal rate, regular rhythm. Normal S1 and S2.  Good peripheral circulation. Respiratory: Normal respiratory effort without tachypnea or retractions. Lungs CTAB. Good air entry to the bases with no decreased or absent breath  sounds. Musculoskeletal: Full range of motion to all extremities. No gross deformities appreciated. Neurologic:  Normal speech and language. No gross focal neurologic deficits are appreciated.  Skin:  Skin is warm, dry and intact. No rash noted. Psychiatric: Mood and affect are normal. Speech and behavior are normal. Patient exhibits appropriate insight and judgement.   ____________________________________________   LABS (all labs ordered are listed, but only abnormal results are displayed)  Labs Reviewed  COMPREHENSIVE METABOLIC PANEL - Abnormal; Notable for the following components:      Result Value   Total Protein 8.7 (*)    All other components within normal limits  CBC WITH DIFFERENTIAL/PLATELET - Abnormal; Notable for the following components:   WBC 11.8 (*)    Hemoglobin 9.2 (*)    HCT 30.1 (*)    MCV 67.2 (*)    MCH 20.5 (*)    RDW 18.5 (*)    Neutro Abs 8.8 (*)    Monocytes Absolute 1.1 (*)    All other components within normal limits  GROUP A STREP BY PCR  CULTURE, BLOOD (ROUTINE X 2)  CULTURE, BLOOD (ROUTINE X 2)  LACTIC ACID, PLASMA  LACTIC ACID, PLASMA   ____________________________________________  EKG   ____________________________________________  RADIOLOGY I personally viewed and evaluated these images as part of my medical decision making, as well as reviewing the written report by the radiologist.  ED Provider Interpretation: Findings consistent with tonsillitis.  Possible early phlegmon in the tonsil but no discrete fluid collection.  CT Soft Tissue Neck W Contrast  Result Date: 12/08/2020 CLINICAL DATA:  Epiglottitis or tonsillitis suspected Voice changes, sore throat, uvular deviation EXAM: CT NECK WITH CONTRAST TECHNIQUE: Multidetector CT imaging of the neck was performed using the standard protocol following the bolus administration of intravenous contrast. CONTRAST:  49mL OMNIPAQUE IOHEXOL 300 MG/ML  SOLN COMPARISON:  None. FINDINGS: Pharynx  and larynx: Enlarged and enhancing palatine tonsil, compatible with tonsillitis. Ill-defined small (5 mm) hypodensity within the posteromedial left tonsil may represent early abscess/phlegmon. Surrounding oropharyngeal edema with mild narrowing of the oropharynx. Unremarkable epiglottis. Salivary glands: No inflammation, mass, or stone. Thyroid: Normal. Lymph nodes: Enlarged jugulodigastric and upper cervical chain nodes bilaterally. Vascular: No obvious large vessel occlusion with limited evaluation due to non arterial timing. Limited intracranial: Negative. Visualized orbits: Negative. Mastoids and visualized paranasal sinuses: Clear. Skeleton: No evidence of acute abnormality. Upper chest: Visualized lung apices are clear IMPRESSION: 1. Enlarged and enhancing palatine tonsils, compatible with tonsillitis. Ill-defined small (5 mm) hypodensity within the posteromedial left tonsil may represent early abscess/phlegmon. Surrounding oropharyngeal edema with mild narrowing of the oropharynx. 2. Enlarged jugulodigastric and upper cervical chain nodes bilaterally, nonspecific but probably reactive given above findings. 3. Enlarged adenoids, potentially hypertrophy or pharyngitis. Electronically Signed   By: Feliberto Harts M.D.   On: 12/08/2020 14:52    ____________________________________________    PROCEDURES  Procedure(s) performed:    Procedures    Medications  sodium chloride 0.9 % bolus 1,000 mL (0 mLs Intravenous Stopped 12/08/20 1603)  Ampicillin-Sulbactam (UNASYN) 3 g in sodium chloride 0.9 % 100 mL IVPB (0 g Intravenous Stopped 12/08/20 1505)  dexamethasone (DECADRON) injection 10 mg (10 mg Intravenous Given 12/08/20 1245)  iohexol (OMNIPAQUE) 300 MG/ML solution 75 mL (75 mLs Intravenous Contrast Given 12/08/20 1426)  lidocaine (XYLOCAINE) 2 % viscous mouth solution 15 mL (15 mLs Mouth/Throat Given 12/08/20 1603)     ____________________________________________   INITIAL IMPRESSION /  ASSESSMENT AND PLAN / ED COURSE  Pertinent labs & imaging results that were available during my care of the patient were reviewed by me and considered in my medical decision making (see chart for details).  Review of the [place] CSRS was performed in accordance of the [facility] prior to dispensing any controlled drugs.  Clinical Course as of 12/08/20 1655  Tue Dec 08, 2020  1626 I discussed the results with the patient.  At this time discussed options of at home medications to include antibiotic, steroid, some control medication and Magic mouthwash versus admission for observation to ensure that patient continues to improve.  Patient would like to go home at this time.  She is maintaining her secretions, able to swallow medications, able to stay hydrated with fluids.  I discussed concerning signs and symptoms to include increasing pain, difficulty breathing, inability to swallow foods or medications, any difficulty breathing.  Patient is agreeable to returning for any worsening symptoms.  At this time she would like to go home. [JC]    Clinical Course User Index [JC] Thaddeaus Monica, Delorise Royals, PA-C          Patient's diagnosis is consistent with tonsillitis.  Patient presents emergency department with tonsillar hypertrophy, sore throat, voice changes.  Differential included strep, viral pharyngitis, peritonsillar abscess, retropharyngeal abscess.  Labs, imaging were performed.  No evidence of peritonsillar retropharyngeal abscess.  Negative strep test.  Given the amount of tonsillar hypertrophy I will continue antibiotics at home even though the patient had a negative strep test.  I will also provide oral steroids, Magic mouthwash for symptom control.  I have given strict return precautions for any worsening.  Patient request to be discharged at this time.  Patient is controlling her own secretions, no difficulty breathing, able to swallow medications and fluids..  Follow-up primary care as needed.  Patient is  given ED precautions to return to the ED for any worsening or new symptoms.     ____________________________________________  FINAL CLINICAL IMPRESSION(S) / ED DIAGNOSES  Final diagnoses:  Tonsillitis      NEW MEDICATIONS STARTED DURING THIS VISIT:  ED Discharge Orders          Ordered    amoxicillin (AMOXIL) 875 MG tablet  2 times daily        12/08/20 1654    magic mouthwash w/lidocaine SOLN  4 times daily       Note to Pharmacy: Dispense in a 1/1/1 ratio. Use lidocaine, diphenhydramine, prednisolone   12/08/20 1654    predniSONE (DELTASONE) 50 MG tablet  Daily with breakfast        12/08/20 1654                This chart was dictated using voice recognition software/Dragon. Despite best efforts to proofread, errors can occur which can change the meaning. Any change was purely unintentional.    Racheal Patches, PA-C 12/08/20 1655    Sharyn Creamer, MD 12/10/20 [phone]

## 2020-12-08 NOTE — ED Notes (Signed)
Pt placed on monitor.  

## 2020-12-08 NOTE — ED Triage Notes (Signed)
Pt here with a sore throat since Saturday. Pt thinks that her tonsils are swollen. Pt states moderate pain when swallowing. Pt denies N/V/D. Pt in NAD in triage.

## 2020-12-13 LAB — CULTURE, BLOOD (ROUTINE X 2)
Culture: NO GROWTH
Culture: NO GROWTH
Special Requests: ADEQUATE

## 2020-12-15 DIAGNOSIS — Z419 Encounter for procedure for purposes other than remedying health state, unspecified: Secondary | ICD-10-CM | POA: Diagnosis not present

## 2020-12-17 ENCOUNTER — Encounter: Payer: Self-pay | Admitting: Emergency Medicine

## 2020-12-17 ENCOUNTER — Other Ambulatory Visit: Payer: Self-pay

## 2020-12-17 ENCOUNTER — Emergency Department
Admission: EM | Admit: 2020-12-17 | Discharge: 2020-12-17 | Disposition: A | Discharge status: Home / Self Care | Payer: Medicaid Other | Attending: Emergency Medicine | Admitting: Emergency Medicine

## 2020-12-17 DIAGNOSIS — J039 Acute tonsillitis, unspecified: Secondary | ICD-10-CM | POA: Diagnosis not present

## 2020-12-17 MED ORDER — DEXAMETHASONE SODIUM PHOSPHATE 10 MG/ML IJ SOLN
10.0000 mg | Freq: Once | INTRAMUSCULAR | Status: AC
  Administered 2020-12-17: 10 mg via INTRAMUSCULAR
  Filled 2020-12-17: qty 1

## 2020-12-17 NOTE — Discharge Instructions (Addendum)
Continue with antibiotics as prescribed, and the steroid as directed.  Consider putting the steroid dose in half taking 30 mg in the morning 20 mg in the evening.  Return to the ED for any worsening symptoms.  Otherwise you should follow-up with [facility] for evaluation of your enlarged tonsils.

## 2020-12-17 NOTE — ED Triage Notes (Signed)
Pt comes into the ED via POV c/o sore throat. Pt states she was seen on 12/08/20 given abx, and told to come back in if still having problems.  Pt has clear speech and is able to swallow saliva at this time.

## 2020-12-17 NOTE — ED Notes (Signed)
ED Provider at bedside. 

## 2020-12-18 NOTE — ED Provider Notes (Signed)
[facility] Emergency Department Provider Note ____________________________________________  Time seen: 2011  I have reviewed the triage vital signs and the nursing notes.  HISTORY  Chief Complaint  Sore Throat  HPI Kristin Hopkins is a 20 y.o. female returns to the ED for evaluation of tonsillitis.  Patient was evaluated 2 days prior with a work-up that included a CT exam which confirmed bilateral tonsillitis with possible early abscess formation on the left.  Patient was started empirically on antibiotics and steroids, presents today after being told to return if she still was having problems.  She denies any sore throat pain, difficulty swallowing or controlling secretions, or fevers.  She notes some improved fullness and swelling to the tonsils even since checking it in triage room.  Tolerating antibiotics and steroids as prescribed.  She does note that it seems like her inflammation seems to slightly increase when is time to dose her next steroid.  Past Medical History:  Diagnosis Date   Anemia     Patient Active Problem List   Diagnosis Date Noted   Abnormal uterine bleeding 01/27/2020   Right ovarian cyst 01/27/2020    History reviewed. No pertinent surgical history.  Prior to Admission medications   Medication Sig Start Date End Date Taking? Authorizing Provider  amoxicillin (AMOXIL) 875 MG tablet Take 1 tablet (875 mg total) by mouth 2 (two) times daily. 12/08/20   Cuthriell, Delorise Royals, PA-C  ibuprofen (ADVIL) 600 MG tablet Take 1 tablet (600 mg total) by mouth every 8 (eight) hours as needed. 01/12/20   Irean Hong, MD  magic mouthwash w/lidocaine SOLN Take 5 mLs by mouth 4 (four) times daily. 12/08/20   Cuthriell, Delorise Royals, PA-C  predniSONE (DELTASONE) 50 MG tablet Take 1 tablet (50 mg total) by mouth daily with breakfast. 12/08/20   Cuthriell, Delorise Royals, PA-C    Allergies Patient has no known allergies.  Family History  Problem Relation  Age of Onset   Diabetes Mother     Social History Social History   Tobacco Use   Smoking status: Never   Smokeless tobacco: Never  Vaping Use   Vaping Use: Never used  Substance Use Topics   Alcohol use: No   Drug use: No    Review of Systems  Constitutional: Negative for fever. Eyes: Negative for visual changes. ENT: Negative for sore throat.  Reports enlarged tonsils as above. Cardiovascular: Negative for chest pain. Respiratory: Negative for shortness of breath. Gastrointestinal: Negative for abdominal pain, vomiting and diarrhea. Genitourinary: Negative for dysuria. Musculoskeletal: Negative for back pain. Skin: Negative for rash. Neurological: Negative for headaches, focal weakness or numbness. ____________________________________________  PHYSICAL EXAM:  VITAL SIGNS: ED Triage Vitals  Enc Vitals Group     BP 12/17/20 1631 122/78     Pulse Rate 12/17/20 1631 82     Resp 12/17/20 1629 16     Temp 12/17/20 1631 98.8 F (37.1 C)     Temp Source 12/17/20 1631 Oral     SpO2 12/17/20 1631 100 %     Weight 12/17/20 1632 154 lb (69.9 kg)     Height 12/17/20 1632 5\' 5"  (1.651 m)     Head Circumference --      Peak Flow --      Pain Score 12/17/20 1631 5     Pain Loc --      Pain Edu? --      Excl. in GC? --     Constitutional: Alert and oriented. Well  appearing and in no distress. Head: Normocephalic and atraumatic. Eyes: Conjunctivae are normal. PERRL. Normal extraocular movements Ears: Canals clear. TMs intact bilaterally. Nose: No congestion/rhinorrhea/epistaxis. Mouth/Throat: Mucous membranes are moist.  Uvula is midline and tonsils are enlarged 3+ without erythema or exudates.  Patient's voice is strong and not muffled, and she is able to control her oral secretions. Neck: Supple. No thyromegaly. Hematological/Lymphatic/Immunological: No cervical lymphadenopathy. Cardiovascular: Normal rate, regular rhythm. Normal distal pulses. Respiratory: Normal  respiratory effort. No wheezes/rales/rhonchi. Gastrointestinal: Soft and nontender. No distention. Musculoskeletal: Nontender with normal range of motion in all extremities.  Neurologic:  Normal gait without ataxia. Normal speech and language. No gross focal neurologic deficits are appreciated. Skin:  Skin is warm, dry and intact. No rash noted. Psychiatric: Mood and affect are normal. Patient exhibits appropriate insight and judgment. ____________________________________________    {LABS (pertinent positives/negatives)  ____________________________________________  {EKG  ____________________________________________   RADIOLOGY Official radiology report(s): No results found. ____________________________________________  PROCEDURES  Decadron 10 mg IM  Procedures ____________________________________________   INITIAL IMPRESSION / ASSESSMENT AND PLAN / ED COURSE  As part of my medical decision making, I reviewed the following data within the electronic MEDICAL RECORD NUMBER Notes from prior ED visits   Patient with subsequent ED evaluation for tonsillitis treated with steroids and antibiotics.  Patient presents with some concerns over some intermittent swelling, but reports improved symptoms and no ongoing pain or difficulty controlling secretions.  She is treated with a Decadron dose in the ED, and is discharged with direction to continue with antibiotics and of the steroids as prescribed she is also advised that she makes with the steroid dose in half taking and 30 mg in the a.m. and 20 mg in the p.m.  She will follow with [facility] for ongoing symptoms peer return precautions have again been reviewed.  Kristin Hopkins was evaluated in Emergency Department on 12/18/2020 for the symptoms described in the history of present illness. She was evaluated in the context of the global COVID-19 pandemic, which necessitated consideration that the patient might be at risk for infection with the  SARS-CoV-2 virus that causes COVID-19. Institutional protocols and algorithms that pertain to the evaluation of patients at risk for COVID-19 are in a state of rapid change based on information released by regulatory bodies including the CDC and federal and state organizations. These policies and algorithms were followed during the patient's care in the ED. ____________________________________________  FINAL CLINICAL IMPRESSION(S) / ED DIAGNOSES  Final diagnoses:  Tonsillitis      Sherlyn Ebbert, Charlesetta Ivory, PA-C 12/18/20 2302    Arnaldo Natal, MD 12/18/20 2312

## 2021-01-14 DIAGNOSIS — Z419 Encounter for procedure for purposes other than remedying health state, unspecified: Secondary | ICD-10-CM | POA: Diagnosis not present

## 2021-01-25 DIAGNOSIS — J351 Hypertrophy of tonsils: Secondary | ICD-10-CM | POA: Diagnosis not present

## 2021-01-27 ENCOUNTER — Encounter: Payer: Self-pay | Admitting: Otolaryngology

## 2021-02-01 NOTE — Discharge Instructions (Signed)
T & A INSTRUCTION SHEET - [facility]  P. SCOTT BENNETT, MD  INFORMATION SHEET FOR A TONSILLECTOMY AND ADENDOIDECTOMY  About Your Tonsils and Adenoids The tonsils and adenoids are normal body tissues that are part of our immune system. They normally help to protect us against diseases that may enter our mouth and nose. However, sometimes the tonsils and/or adenoids become too large and obstruct our breathing, especially at night.  If either of these things happen it helps to remove the tonsils and adenoids in order to become healthier. The operation to remove the tonsils and adenoids is called a tonsillectomy and adenoidectomy.  The Location of Your Tonsils and Adenoids The tonsils are located in the back of the throat on both side and sit in a cradle of muscles. The adenoids are located in the roof of the mouth, behind the nose, and closely associated with the opening of the Eustachian tube to the ear.  Surgery on Tonsils and Adenoids A tonsillectomy and adenoidectomy is a short operation which takes about thirty minutes. This includes being put to sleep and being awakened. Tonsillectomies and adenoidectomies are performed at [facility] and may require observation period in the recovery room prior to going home. Children are required to remain in the recovery area for 45 minutes after surgery.  Following the Operation for a Tonsillectomy A cautery machine is used to control bleeding.  Bleeding from a tonsillectomy and adenoidectomy is minimal and postoperatively the risk of bleeding is approximately four percent, although this rarely life threatening.  After your tonsillectomy and adenoidectomy post-op care at home: 1. Our patients are able to go home the same day. You may be given prescriptions for pain medications and antibiotics, if indicated. 2. It is extremely important to remember that fluid intake is of utmost importance after a  tonsillectomy. The amount that you drink must be maintained in the postoperative period. A good indication of whether a child is getting enough fluid is whether his/her urine output is constant.  As long as children are urinating or wetting their diaper every 6 - 8 hours this is usually enough fluid intake.   3. Although rare, this is a risk of some bleeding in the first ten days after surgery. This usually occurs between day five and nine postoperatively. This risk of bleeding is approximately four percent.  If you or your child should have any bleeding you should remain calm and notify our office or go directly to the Emergency Room at  [facility] where they will contact us. Our doctors are available seven days a week for notification. We recommend sitting up quietly in a chair, place an ice pack on the front of the neck and spitting out the blood gently until we are able to contact you. Adults should gargle gently with ice water and this may help stop the bleeding. If the bleeding does not stop after a short time, i.e. 10 to 15 minutes, or seems to be increasing again, please contact us or go to the hospital.   4. It is common for the pain to be worse at 5 - 7 days postoperatively. This occurs because the "scab" is peeling off and the mucous membrane (skin of the throat) is growing back where the tonsils were.   5. It is common for a low-grade fever, less than 102, during the first week after a tonsillectomy and adenoidectomy. It is usually due to not drinking enough   liquids, and we suggest your use liquid Tylenol (acetaminophen) or the pain medicine with Tylenol (acetaminophen) prescribed in order to keep your temperature below 102. Please follow the directions on the back of the bottle. 6. Do not take aspirin or any products that contain aspirin such as Bufferin, Anacin, Ecotrin, aspirin gum, Goodies, BC headache powders, etc., after a T&A because it can promote bleeding.  DO NOT TAKE  MOTRIN OR IBUPROFEN. Please check with our office before administering any other medication that may been prescribed by other doctors during the two-week post-operative period. 7. If you happen to look in the mirror or into your child's mouth you will see white/gray patches on the back of the throat.  This is what a scab looks like in the mouth and is normal after having a tonsillectomy and adenoidectomy. It will disappear once the tonsil area heals completely. However, it may cause a noticeable odor, and this too will disappear with time.     8. You or your child may experience ear pain after having a tonsillectomy and adenoidectomy. This is called referred pain and comes from the throat, but it is felt in the ears. Ear pain is quite common and expected. It will usually go away after ten days. There is usually nothing wrong with the ears, and it is primarily due to the healing area stimulating the nerve to the ear that runs along the side of the throat. Use either the prescribed pain medicine or Tylenol (acetaminophen) as needed.  9. The throat tissues after a tonsillectomy are obviously sensitive. Smoking around children who have had a tonsillectomy significantly increases the risk of bleeding.  DO NOT SMOKE! What to Expect Each Day  First Day at Home 1. Patients will be discharged home the same day.  2. Drink at least four glasses of liquid a day. Clear, cool liquids are recommended. Fruit juices containing citric acid are not recommended because they tend to cause pain. Carbonated beverages are allowed if you pour them from glass to glass to remove the bubbles as these tend to cause discomfort. Avoid alcoholic beverages.  3. Eat very soft foods such as soups, broth, jello, custard, pudding, ice cream, popsicles, applesauce, mashed potatoes, and in general anything that you can crush between your tongue and the roof of your mouth. Try adding Carnation Instant Breakfast Mix into your food for extra  calories. It is not uncommon to lose 5 to 10 pounds of fluid weight. The weight will be gained back quickly once you're feeling better and drinking more.  4. Sleep with your head elevated on two pillows for about three days to help decrease the swelling.  5. DO NOT SMOKE!  Day Two  1. Rest as much as possible. Use common sense in your activities.  2. Continue drinking at least four glasses of liquid per day.  3. Follow the soft diet.  4. Use your pain medication as needed.  Day Three  1. Advance your activity as you are able and continue to follow the previous day's suggestions.  Days Four Through Six  1. Advance your diet and begin to eat more solid foods such as chopped hamburger. 2. Advance your activities slowly. Children should be kept mostly around the house.  3. Not uncommonly, there will be more pain at this time. It is temporary, usually lasting a day or two.  Day Seven Through Ten  1. Most individuals by this time are able to return to work or school unless otherwise instructed.   Consider sending children back to school for a half day on the first day back. 

## 2021-02-02 ENCOUNTER — Encounter: Payer: Self-pay | Admitting: Otolaryngology

## 2021-02-02 ENCOUNTER — Encounter
Admission: RE | Disposition: A | Discharge status: Home / Self Care | Payer: Self-pay | Source: Home / Self Care | Attending: Otolaryngology

## 2021-02-02 ENCOUNTER — Ambulatory Visit: Payer: Medicaid Other | Admitting: Anesthesiology

## 2021-02-02 ENCOUNTER — Ambulatory Visit
Admission: RE | Admit: 2021-02-02 | Discharge: 2021-02-02 | Disposition: A | Discharge status: Home / Self Care | Payer: Medicaid Other | Attending: Otolaryngology | Admitting: Otolaryngology

## 2021-02-02 ENCOUNTER — Other Ambulatory Visit: Payer: Self-pay

## 2021-02-02 DIAGNOSIS — J353 Hypertrophy of tonsils with hypertrophy of adenoids: Secondary | ICD-10-CM | POA: Insufficient documentation

## 2021-02-02 DIAGNOSIS — J352 Hypertrophy of adenoids: Secondary | ICD-10-CM | POA: Diagnosis not present

## 2021-02-02 DIAGNOSIS — J3503 Chronic tonsillitis and adenoiditis: Secondary | ICD-10-CM | POA: Diagnosis not present

## 2021-02-02 HISTORY — PX: TONSILLECTOMY AND ADENOIDECTOMY: SHX28

## 2021-02-02 LAB — POCT PREGNANCY, URINE: Preg Test, Ur: NEGATIVE

## 2021-02-02 SURGERY — TONSILLECTOMY AND ADENOIDECTOMY
Anesthesia: General | Site: Mouth | Laterality: Bilateral

## 2021-02-02 MED ORDER — HYDROCODONE-ACETAMINOPHEN 7.5-325 MG/15ML PO SOLN: ORAL | 0 refills | Status: DC

## 2021-02-02 MED ORDER — FENTANYL CITRATE (PF) 100 MCG/2ML IJ SOLN
INTRAMUSCULAR | Status: DC | PRN
  Administered 2021-02-02 (×2): 50 ug via INTRAVENOUS

## 2021-02-02 MED ORDER — OXYMETAZOLINE HCL 0.05 % NA SOLN
NASAL | Status: DC | PRN
  Administered 2021-02-02: 1

## 2021-02-02 MED ORDER — ONDANSETRON HCL 4 MG/2ML IJ SOLN: 4.0000 mg | Freq: Once | INTRAMUSCULAR | Status: DC | PRN

## 2021-02-02 MED ORDER — ACETAMINOPHEN 10 MG/ML IV SOLN
1000.0000 mg | Freq: Once | INTRAVENOUS | Status: AC
  Administered 2021-02-02: 07:00:00 1000 mg via INTRAVENOUS

## 2021-02-02 MED ORDER — PREDNISOLONE SODIUM PHOSPHATE 15 MG/5ML PO SOLN: ORAL | 0 refills | Status: DC

## 2021-02-02 MED ORDER — BUPIVACAINE HCL 0.25 % IJ SOLN
INTRAMUSCULAR | Status: DC | PRN
  Administered 2021-02-02: 2 mL

## 2021-02-02 MED ORDER — LIDOCAINE HCL (CARDIAC) PF 100 MG/5ML IV SOSY
PREFILLED_SYRINGE | INTRAVENOUS | Status: DC | PRN
  Administered 2021-02-02: 60 mg via INTRAVENOUS

## 2021-02-02 MED ORDER — FENTANYL CITRATE PF 50 MCG/ML IJ SOSY: 25.0000 ug | PREFILLED_SYRINGE | INTRAMUSCULAR | Status: DC | PRN

## 2021-02-02 MED ORDER — OXYCODONE HCL 5 MG PO TABS: 5.0000 mg | ORAL_TABLET | Freq: Once | ORAL | Status: AC | PRN

## 2021-02-02 MED ORDER — PROPOFOL 10 MG/ML IV BOLUS
INTRAVENOUS | Status: DC | PRN
  Administered 2021-02-02: 170 mg via INTRAVENOUS

## 2021-02-02 MED ORDER — OXYCODONE HCL 5 MG/5ML PO SOLN
5.0000 mg | Freq: Once | ORAL | Status: AC | PRN
  Administered 2021-02-02: 09:00:00 5 mg via ORAL

## 2021-02-02 MED ORDER — LACTATED RINGERS IV SOLN: INTRAVENOUS | Status: DC

## 2021-02-02 MED ORDER — DEXAMETHASONE SODIUM PHOSPHATE 4 MG/ML IJ SOLN
INTRAMUSCULAR | Status: DC | PRN
  Administered 2021-02-02: 6 mg via INTRAVENOUS

## 2021-02-02 MED ORDER — MIDAZOLAM HCL 5 MG/5ML IJ SOLN
INTRAMUSCULAR | Status: DC | PRN
  Administered 2021-02-02: 2 mg via INTRAVENOUS

## 2021-02-02 MED ORDER — ONDANSETRON HCL 4 MG/2ML IJ SOLN
INTRAMUSCULAR | Status: DC | PRN
  Administered 2021-02-02: 4 mg via INTRAVENOUS

## 2021-02-02 MED ORDER — SUCCINYLCHOLINE CHLORIDE 200 MG/10ML IV SOSY
PREFILLED_SYRINGE | INTRAVENOUS | Status: DC | PRN
  Administered 2021-02-02: 100 mg via INTRAVENOUS

## 2021-02-02 SURGICAL SUPPLY — 15 items
BLADE BOVIE TIP EXT 4 (BLADE) ×3 IMPLANT
CANISTER SUCT 1200ML W/VALVE (MISCELLANEOUS) ×3 IMPLANT
CATH ROBINSON RED A/P 10FR (CATHETERS) ×3 IMPLANT
COAG SUCT 10F 3.5MM HAND CTRL (MISCELLANEOUS) ×3 IMPLANT
ELECT REM PT RETURN 9FT ADLT (ELECTROSURGICAL) ×3
ELECTRODE REM PT RTRN 9FT ADLT (ELECTROSURGICAL) ×1 IMPLANT
GLOVE SURG ENC MOIS LTX SZ7.5 (GLOVE) ×3 IMPLANT
KIT TURNOVER KIT A (KITS) ×3 IMPLANT
NS IRRIG 500ML POUR BTL (IV SOLUTION) ×3 IMPLANT
PACK TONSIL AND ADENOID CUSTOM (PACKS) ×3 IMPLANT
PENCIL SMOKE EVACUATOR (MISCELLANEOUS) ×3 IMPLANT
SLEEVE SUCTION 125 (MISCELLANEOUS) ×3 IMPLANT
SOL ANTI-FOG 6CC FOG-OUT (MISCELLANEOUS) ×1 IMPLANT
SOL FOG-OUT ANTI-FOG 6CC (MISCELLANEOUS) ×2
SPONGE TONSIL 1 RF SGL (DISPOSABLE) ×2 IMPLANT

## 2021-02-02 NOTE — Op Note (Signed)
02/02/2021  8:59 AM    [place] Calla Kicks  [id number]   Pre-Op Diagnosis:  Hypertrophy of tonsils  Post-op Diagnosis: Adenotonsillar hyperplasia  Procedure: Adenotonsillectomy  Surgeon: Sandi Mealy., MD  Anesthesia:  General endotracheal  EBL:  Less than 25 cc  Complications:  None  Findings: 3-4+ cryptic tonsils. Moderate adenoid regrowth in superior nasopharynx  Procedure: The patient was taken to the Operating Room and placed in the supine position.  After induction of general endotracheal anesthesia, the table was turned 90 degrees and the patient was draped in the usual fashion for adenoidectomy with the eyes protected.  A mouth gag was inserted into the oral cavity to open the mouth, and examination of the oropharynx showed the uvula was non-bifid. The palate was palpated, and there was no evidence of submucous cleft.  A red rubber catheter was placed through the nostril and used to retract the palate.  Examination of the nasopharynx showed obstructing adenoids.  Under indirect vision with the mirror, an adenotome was placed in the nasopharynx.  The adenoids were curetted free.  Reinspection with a mirror showed excellent removal of the adenoids.  Afrin moistened nasopharyngeal packs were then placed to control bleeding.  The nasopharyngeal packs were removed.  Suction cautery was then used to cauterize the nasopharyngeal bed to obtain hemostasis.   The right tonsil was grasped with an Allis clamp and resected from the tonsillar fossa in the usual fashion with the Bovie. The left tonsil was resected in the same fashion. The Bovie was used to obtain hemostasis. Each tonsillar fossa was then carefully injected with 0.25% marcaine , avoiding intravascular injection. The nose and throat were irrigated and suctioned to remove any adenoid debris or blood clot. The red rubber catheter and mouth gag were  removed with no evidence of active bleeding.  The patient was then returned to the  anesthesiologist for awakening, and was taken to the Recovery Room in stable condition.  Cultures:  None.  Specimens:  Adenoids and tonsils.  Disposition:   PACU to home  Plan: Soft, bland diet and push fluids. Take pain medications and prednisone as prescribed. No strenuous activity for 2 weeks. Follow-up in 3 weeks.  Sandi Mealy 02/02/2021 8:59 AM

## 2021-02-02 NOTE — Transfer of Care (Signed)
Immediate Anesthesia Transfer of Care Note  Patient: Kristin Hopkins  Procedure(s) Performed: TONSILLECTOMY and  ADENOIDECTOMY (Bilateral: Mouth)  Patient Location: PACU  Anesthesia Type: General  Level of Consciousness: awake, alert  and patient cooperative  Airway and Oxygen Therapy: Patient Spontanous Breathing and Patient connected to supplemental oxygen  Post-op Assessment: Post-op Vital signs reviewed, Patient's Cardiovascular Status Stable, Respiratory Function Stable, Patent Airway and No signs of Nausea or vomiting  Post-op Vital Signs: Reviewed and stable  Complications: No notable events documented.

## 2021-02-02 NOTE — Anesthesia Preprocedure Evaluation (Signed)
Anesthesia Evaluation  Patient identified by MRN, date of birth, ID band Patient awake    Reviewed: Allergy & Precautions, NPO status , Patient's Chart, lab work & pertinent test results  Airway Mallampati: I  TM Distance: >3 FB Neck ROM: Full    Dental no notable dental hx.    Pulmonary neg pulmonary ROS,    Pulmonary exam normal        Cardiovascular negative cardio ROS Normal cardiovascular exam     Neuro/Psych negative neurological ROS  negative psych ROS   GI/Hepatic negative GI ROS, Neg liver ROS,   Endo/Other  negative endocrine ROS  Renal/GU negative Renal ROS     Musculoskeletal negative musculoskeletal ROS (+)   Abdominal Normal abdominal exam  (+)   Peds  Hematology  (+) anemia ,   Anesthesia Other Findings Hypertrophy of tonsils  Reproductive/Obstetrics                             Anesthesia Physical Anesthesia Plan  ASA: 2  Anesthesia Plan: General   Post-op Pain Management: Ofirmev IV (intra-op)   Induction: Intravenous  PONV Risk Score and Plan: 3 and Midazolam, Treatment may vary due to age or medical condition, Ondansetron and Dexamethasone  Airway Management Planned: Oral ETT  Additional Equipment:   Intra-op Plan:   Post-operative Plan: Extubation in OR  Informed Consent: I have reviewed the patients History and Physical, chart, labs and discussed the procedure including the risks, benefits and alternatives for the proposed anesthesia with the patient or authorized representative who has indicated his/her understanding and acceptance.     Dental advisory given  Plan Discussed with: CRNA  Anesthesia Plan Comments:         Anesthesia Quick Evaluation

## 2021-02-02 NOTE — Anesthesia Procedure Notes (Signed)
Procedure Name: Intubation Date/Time: 02/02/2021 8:25 AM Performed by: Dionne Bucy, CRNA Pre-anesthesia Checklist: Patient identified, Patient being monitored, Timeout performed, Emergency Drugs available and Suction available Patient Re-evaluated:Patient Re-evaluated prior to induction Oxygen Delivery Method: Circle system utilized Preoxygenation: Pre-oxygenation with 100% oxygen Induction Type: IV induction Ventilation: Mask ventilation without difficulty Laryngoscope Size: Mac and 3 Grade View: Grade I Tube type: Oral Rae Tube size: 7.0 mm Number of attempts: 1 Placement Confirmation: ETT inserted through vocal cords under direct vision, positive ETCO2 and breath sounds checked- equal and bilateral Secured at: 21 cm Tube secured with: Tape Dental Injury: Teeth and Oropharynx as per pre-operative assessment

## 2021-02-02 NOTE — Anesthesia Postprocedure Evaluation (Signed)
Anesthesia Post Note  Patient: Kristin Hopkins  Procedure(s) Performed: TONSILLECTOMY and  ADENOIDECTOMY (Bilateral: Mouth)     Patient location during evaluation: PACU Anesthesia Type: General Level of consciousness: awake and alert Pain management: pain level controlled Vital Signs Assessment: post-procedure vital signs reviewed and stable Respiratory status: spontaneous breathing and nonlabored ventilation Cardiovascular status: blood pressure returned to baseline Postop Assessment: no apparent nausea or vomiting Anesthetic complications: no   No notable events documented.  Richele Strand [employer]

## 2021-02-02 NOTE — H&P (Signed)
History and physical reviewed and will be scanned in later. No change in medical status reported by the patient or family, appears stable for surgery. All questions regarding the procedure answered, and patient (or family if a child) expressed understanding of the procedure. ? ?Kristin Hopkins S Kristin Hopkins ?@TODAY@ ?

## 2021-02-03 ENCOUNTER — Encounter: Payer: Self-pay | Admitting: Otolaryngology

## 2021-02-03 LAB — SURGICAL PATHOLOGY

## 2021-02-14 DIAGNOSIS — Z419 Encounter for procedure for purposes other than remedying health state, unspecified: Secondary | ICD-10-CM | POA: Diagnosis not present

## 2021-03-17 DIAGNOSIS — Z419 Encounter for procedure for purposes other than remedying health state, unspecified: Secondary | ICD-10-CM | POA: Diagnosis not present

## 2021-04-14 DIAGNOSIS — Z419 Encounter for procedure for purposes other than remedying health state, unspecified: Secondary | ICD-10-CM | POA: Diagnosis not present

## 2021-05-15 DIAGNOSIS — Z419 Encounter for procedure for purposes other than remedying health state, unspecified: Secondary | ICD-10-CM | POA: Diagnosis not present

## 2021-06-14 DIAGNOSIS — Z419 Encounter for procedure for purposes other than remedying health state, unspecified: Secondary | ICD-10-CM | POA: Diagnosis not present

## 2021-07-15 DIAGNOSIS — Z419 Encounter for procedure for purposes other than remedying health state, unspecified: Secondary | ICD-10-CM | POA: Diagnosis not present

## 2021-07-16 ENCOUNTER — Other Ambulatory Visit (HOSPITAL_COMMUNITY)
Admission: RE | Admit: 2021-07-16 | Discharge: 2021-07-16 | Disposition: A | Discharge status: Home / Self Care | Payer: Medicaid Other | Source: Ambulatory Visit | Attending: Certified Nurse Midwife | Admitting: Certified Nurse Midwife

## 2021-07-16 ENCOUNTER — Ambulatory Visit (INDEPENDENT_AMBULATORY_CARE_PROVIDER_SITE_OTHER): Payer: BC Managed Care – PPO | Admitting: Obstetrics and Gynecology

## 2021-07-16 ENCOUNTER — Encounter: Payer: Self-pay | Admitting: Obstetrics and Gynecology

## 2021-07-16 VITALS — BP 109/69 | HR 73 | Resp 16 | Ht 65.0 in | Wt 159.9 lb

## 2021-07-16 DIAGNOSIS — B379 Candidiasis, unspecified: Secondary | ICD-10-CM | POA: Diagnosis not present

## 2021-07-16 DIAGNOSIS — N760 Acute vaginitis: Secondary | ICD-10-CM

## 2021-07-16 DIAGNOSIS — Z01419 Encounter for gynecological examination (general) (routine) without abnormal findings: Secondary | ICD-10-CM | POA: Diagnosis not present

## 2021-07-16 DIAGNOSIS — Z113 Encounter for screening for infections with a predominantly sexual mode of transmission: Secondary | ICD-10-CM | POA: Insufficient documentation

## 2021-07-16 DIAGNOSIS — R21 Rash and other nonspecific skin eruption: Secondary | ICD-10-CM | POA: Diagnosis not present

## 2021-07-16 DIAGNOSIS — B9689 Other specified bacterial agents as the cause of diseases classified elsewhere: Secondary | ICD-10-CM

## 2021-07-16 MED ORDER — HYDROCORTISONE 0.5 % EX CREA: 1.0000 "application " | TOPICAL_CREAM | Freq: Two times a day (BID) | CUTANEOUS | 0 refills | Status: AC

## 2021-07-16 NOTE — Patient Instructions (Signed)

## 2021-07-16 NOTE — Progress Notes (Signed)
GYNECOLOGY ANNUAL PHYSICAL EXAM PROGRESS NOTE  Subjective:    Kristin Hopkins is a 21 y.o. G0P0000 female who presents for an annual exam. The patient has no complaints today. The patient is sexually active. The patient participates in regular exercise: yes. Has the patient ever been transfused or tattooed?: yes. The patient reports that there is not domestic violence in her life. Is currently in school (studying biochemistry) and working  Reports a rash that has been present for ~ 2-3 weeks. Notes that it is red and itchy. Has not changed soaps or detergents. Denies any recent contact irritants.   Menstrual History: Menarche age: 73 Patient's last menstrual period was 06/23/2021 (exact date). Period Cycle (Days): 30 Period Duration (Days): 5 Period Pattern: Regular Menstrual Flow: Moderate Menstrual Control: Maxi pad Menstrual Control Change Freq (Hours): 3-4 Dysmenorrhea: (!) Mild Dysmenorrhea Symptoms: Cramping   Gynecologic History:  Contraception: condoms History of STI's: Gonorrhea and Chlamydia Last Pap: 08/05/2020. Results were: normal.  Denies h/o abnormal pap smears.   OB History  Gravida Para Term Preterm AB Living  0 0 0 0 0 0  SAB IAB Ectopic Multiple Live Births  0 0 0 0 0    Past Medical History:  Diagnosis Date   Anemia     Past Surgical History:  Procedure Laterality Date   NO PAST SURGERIES     TONSILLECTOMY AND ADENOIDECTOMY Bilateral 02/02/2021   Procedure: TONSILLECTOMY and  ADENOIDECTOMY;  Surgeon: Geanie Logan, MD;  [facility]: [facility];  Service: ENT;  Laterality: Bilateral;    Family History  Problem Relation Age of Onset   Diabetes Mother     Social History   Socioeconomic History   Marital status: Single    Spouse name: Not on file   Number of children: Not on file   Years of education: Not on file   Highest education level: Not on file  Occupational History   Not on file  Tobacco Use   Smoking status: Never    Smokeless tobacco: Never  Vaping Use   Vaping Use: Never used  Substance and Sexual Activity   Alcohol use: No   Drug use: No   Sexual activity: Yes    Birth control/protection: Condom  Other Topics Concern   Not on file  Social History Narrative   Not on file   Social Determinants of Health   Financial Resource Strain: Not on file  Food Insecurity: Not on file  Transportation Needs: Not on file  Physical Activity: Not on file  Stress: Not on file  Social Connections: Not on file  Intimate Partner Violence: Not on file    Current Outpatient Medications on File Prior to Visit  Medication Sig Dispense Refill   HYDROcodone-acetaminophen (HYCET) 7.5-325 mg/15 ml solution 10-15 cc PO every 4-6 hours as needed for pain 300 mL 0   prednisoLONE (ORAPRED) 15 MG/5ML solution 10 cc PO BID x 3 days, then 5 cc PO BID x 3 days, then 5 cc PO QD x 3 days 120 mL 0   No current facility-administered medications on file prior to visit.    No Known Allergies   Review of Systems Constitutional: negative for chills, fatigue, fevers and sweats Eyes: negative for irritation, redness and visual disturbance Ears, nose, mouth, throat, and face: negative for hearing loss, nasal congestion, snoring and tinnitus Respiratory: negative for asthma, cough, sputum Cardiovascular: negative for chest pain, dyspnea, exertional chest pressure/discomfort, irregular heart beat, palpitations and syncope Gastrointestinal: negative for abdominal  pain, change in bowel habits, nausea and vomiting Genitourinary: negative for abnormal menstrual periods, genital lesions, sexual problems and vaginal discharge, dysuria and urinary incontinence Integument/breast: negative for breast lump, breast tenderness and nipple discharge Hematologic/lymphatic: negative for bleeding and easy bruising Musculoskeletal:negative for back pain and muscle weakness Neurological: negative for dizziness, headaches, vertigo and  weakness Endocrine: negative for diabetic symptoms including polydipsia, polyuria and skin dryness Allergic/Immunologic: negative for hay fever and urticaria      Objective:  Blood pressure 109/69, pulse 73, resp. rate 16, height 5\' 5"  (1.651 m), weight 159 lb 14.4 oz (72.5 kg), last menstrual period 06/23/2021.  Body mass index is 26.61 kg/m.  General Appearance:    Alert, cooperative, no distress, appears stated age  Head:    Normocephalic, without obvious abnormality, atraumatic  Eyes:    PERRL, conjunctiva/corneas clear, EOM's intact, both eyes  Ears:    Normal external ear canals, both ears  Nose:   Nares normal, septum midline, mucosa normal, no drainage or sinus tenderness  Throat:   Lips, mucosa, and tongue normal; teeth and gums normal  Neck:   Supple, symmetrical, trachea midline, no adenopathy; thyroid: no enlargement/tenderness/nodules; no carotid bruit or JVD  Back:     Symmetric, no curvature, ROM normal, no CVA tenderness  Lungs:     Clear to auscultation bilaterally, respirations unlabored  Chest Wall:    No tenderness or deformity   Heart:    Regular rate and rhythm, S1 and S2 normal, no murmur, rub or gallop  Breast Exam:    No tenderness, masses, or nipple abnormality  Abdomen:     Soft, non-tender, bowel sounds active all four quadrants, no masses, no organomegaly.    Genitalia:    Pelvic:external genitalia with hypopigmentation, slightly eczematous rash along labia minora, vagina without lesions, or tenderness, + for moderate thin white discharge. rectovaginal septum  normal. Cervix normal in appearance, no cervical motion tenderness, no adnexal masses or tenderness.  Uterus normal size, shape, mobile, regular contours, nontender.  Rectal:    Normal external sphincter.  No hemorrhoids appreciated. Internal exam not done.   Extremities:   Extremities normal, atraumatic, no cyanosis or edema  Pulses:   2+ and symmetric all extremities  Skin:   Skin color, texture, turgor  normal, no rashes or lesions  Lymph nodes:   Cervical, supraclavicular, and axillary nodes normal  Neurologic:   CNII-XII intact, normal strength, sensation and reflexes throughout   .  Labs:  Lab Results  Component Value Date   WBC 11.8 (H) 12/08/2020   HGB 9.2 (L) 12/08/2020   HCT 30.1 (L) 12/08/2020   MCV 67.2 (L) 12/08/2020   PLT 365 12/08/2020    Lab Results  Component Value Date   CREATININE 0.72 12/08/2020   BUN 11 12/08/2020   NA 135 12/08/2020   K 3.5 12/08/2020   CL 103 12/08/2020   CO2 22 12/08/2020    Lab Results  Component Value Date   ALT 13 12/08/2020   AST 18 12/08/2020   ALKPHOS 58 12/08/2020   BILITOT 0.9 12/08/2020    No results found for: TSH   Assessment:   1. Encounter for well woman exam with routine gynecological exam   2. Screen for STD (sexually transmitted disease)   3. Rash in adult      Plan:  Blood tests: CBC with diff and Comprehensive metabolic panel. Breast self exam technique reviewed and patient encouraged to perform self-exam monthly. Contraception: condoms. Discussed healthy lifestyle modifications.  Pap smear  up to date . STD screening (GC/CT) ordered per recommended screening guidelines at patient's consent. Will also assess for vaginitis due to rash and vaginal discharge present today.  Prescribed Hydrocortisone cream for vulvar rash. Should return if no improvement in 1 week.  COVID vaccination status: declines Follow up in 1 year for annual exam   Hildred Laser, MD [facility]

## 2021-07-17 LAB — COMPREHENSIVE METABOLIC PANEL
ALT: 12 IU/L (ref 0–32)
AST: 21 IU/L (ref 0–40)
Albumin/Globulin Ratio: 1.7 (ref 1.2–2.2)
Albumin: 4.8 g/dL (ref 3.9–5.0)
Alkaline Phosphatase: 62 IU/L (ref 44–121)
BUN/Creatinine Ratio: 18 (ref 9–23)
BUN: 13 mg/dL (ref 6–20)
Bilirubin Total: 0.4 mg/dL (ref 0.0–1.2)
CO2: 19 mmol/L — ABNORMAL LOW (ref 20–29)
Calcium: 9.6 mg/dL (ref 8.7–10.2)
Chloride: 101 mmol/L (ref 96–106)
Creatinine, Ser: 0.73 mg/dL (ref 0.57–1.00)
Globulin, Total: 2.8 g/dL (ref 1.5–4.5)
Glucose: 78 mg/dL (ref 70–99)
Potassium: 4.3 mmol/L (ref 3.5–5.2)
Sodium: 138 mmol/L (ref 134–144)
Total Protein: 7.6 g/dL (ref 6.0–8.5)
eGFR: 120 mL/min/{1.73_m2} (ref >59)

## 2021-07-17 LAB — CBC
Hematocrit: 30.7 % — ABNORMAL LOW (ref 34.0–46.6)
Hemoglobin: 8.8 g/dL — ABNORMAL LOW (ref 11.1–15.9)
MCH: 18.8 pg — ABNORMAL LOW (ref 26.6–33.0)
MCHC: 28.7 g/dL — ABNORMAL LOW (ref 31.5–35.7)
MCV: 66 fL — ABNORMAL LOW (ref 79–97)
Platelets: 354 10*3/uL (ref 150–450)
RBC: 4.67 x10E6/uL (ref 3.77–5.28)
RDW: 19.9 % — ABNORMAL HIGH (ref 11.7–15.4)
WBC: 4.3 10*3/uL (ref 3.4–10.8)

## 2021-07-17 MED ORDER — FERROUS SULFATE 325 (65 FE) MG PO TABS: 325.0000 mg | ORAL_TABLET | Freq: Every day | ORAL | 1 refills | Status: AC

## 2021-07-17 NOTE — Addendum Note (Signed)
Addended by: Fabian November on: 07/17/2021 08:39 PM   Modules accepted: Orders

## 2021-07-19 LAB — CERVICOVAGINAL ANCILLARY ONLY
Bacterial Vaginitis (gardnerella): POSITIVE — AB
Candida Glabrata: NEGATIVE
Candida Vaginitis: POSITIVE — AB
Chlamydia: NEGATIVE
Comment: NEGATIVE
Comment: NEGATIVE
Comment: NEGATIVE
Comment: NEGATIVE
Comment: NEGATIVE
Comment: NORMAL
Neisseria Gonorrhea: NEGATIVE
Trichomonas: NEGATIVE

## 2021-07-21 MED ORDER — FLUCONAZOLE 150 MG PO TABS: 150.0000 mg | ORAL_TABLET | Freq: Once | ORAL | 0 refills | Status: AC

## 2021-07-21 MED ORDER — METRONIDAZOLE 500 MG PO TABS: 500.0000 mg | ORAL_TABLET | Freq: Two times a day (BID) | ORAL | 0 refills | Status: AC

## 2021-07-21 NOTE — Addendum Note (Signed)
Addended by: Tommie Raymond on: 07/21/2021 09:25 AM   Modules accepted: Orders

## 2021-08-11 ENCOUNTER — Encounter: Payer: Medicaid Other | Admitting: Certified Nurse Midwife

## 2021-08-14 DIAGNOSIS — Z419 Encounter for procedure for purposes other than remedying health state, unspecified: Secondary | ICD-10-CM | POA: Diagnosis not present

## 2021-09-14 DIAGNOSIS — Z419 Encounter for procedure for purposes other than remedying health state, unspecified: Secondary | ICD-10-CM | POA: Diagnosis not present

## 2021-09-15 ENCOUNTER — Encounter: Payer: BC Managed Care – PPO | Admitting: Certified Nurse Midwife

## 2021-10-15 DIAGNOSIS — Z419 Encounter for procedure for purposes other than remedying health state, unspecified: Secondary | ICD-10-CM | POA: Diagnosis not present

## 2021-10-22 ENCOUNTER — Ambulatory Visit (LOCAL_COMMUNITY_HEALTH_CENTER): Payer: Self-pay

## 2021-10-22 DIAGNOSIS — Z111 Encounter for screening for respiratory tuberculosis: Secondary | ICD-10-CM

## 2021-10-25 ENCOUNTER — Telehealth: Payer: Self-pay

## 2021-10-25 ENCOUNTER — Other Ambulatory Visit: Payer: Medicaid Other

## 2021-10-25 NOTE — Telephone Encounter (Signed)
Call to client to remind her of PPDR appt this am. Per client, something came up this am and unable to make scheduled appt. States will be here this am by 1145. Counseled that if unable to arrive this am, may come this pm for TB skin test reading. Jossie Ng, RN

## 2021-11-14 DIAGNOSIS — Z419 Encounter for procedure for purposes other than remedying health state, unspecified: Secondary | ICD-10-CM | POA: Diagnosis not present

## 2021-12-15 DIAGNOSIS — Z419 Encounter for procedure for purposes other than remedying health state, unspecified: Secondary | ICD-10-CM | POA: Diagnosis not present

## 2022-01-14 DIAGNOSIS — Z419 Encounter for procedure for purposes other than remedying health state, unspecified: Secondary | ICD-10-CM | POA: Diagnosis not present

## 2022-01-16 ENCOUNTER — Other Ambulatory Visit: Payer: Self-pay

## 2022-01-16 DIAGNOSIS — M25551 Pain in right hip: Secondary | ICD-10-CM | POA: Diagnosis not present

## 2022-01-16 DIAGNOSIS — M79604 Pain in right leg: Secondary | ICD-10-CM | POA: Diagnosis not present

## 2022-01-16 DIAGNOSIS — Z5321 Procedure and treatment not carried out due to patient leaving prior to being seen by health care provider: Secondary | ICD-10-CM | POA: Diagnosis not present

## 2022-01-16 NOTE — ED Notes (Signed)
Pt seen by provider in triage. Pt c/o non-traumatic pain from the R hip down her leg.

## 2022-01-17 ENCOUNTER — Emergency Department
Admission: EM | Admit: 2022-01-17 | Discharge: 2022-01-17 | Discharge status: Against Medical Advice | Payer: Medicaid Other | Attending: Emergency Medicine | Admitting: Emergency Medicine

## 2022-01-17 DIAGNOSIS — M5441 Lumbago with sciatica, right side: Secondary | ICD-10-CM | POA: Diagnosis not present

## 2022-01-17 DIAGNOSIS — M25551 Pain in right hip: Secondary | ICD-10-CM | POA: Diagnosis not present

## 2022-01-17 DIAGNOSIS — M545 Low back pain, unspecified: Secondary | ICD-10-CM | POA: Diagnosis not present

## 2022-01-17 NOTE — ED Notes (Signed)
Pt called for treatment x 3, no answer

## 2022-01-17 NOTE — ED Triage Notes (Signed)
  Pt seen by provider in triage. Pt c/o non-traumatic pain from the R hip down her leg. no other complaints

## 2022-01-17 NOTE — ED Notes (Signed)
Patient called with no answer. °

## 2022-01-17 NOTE — ED Notes (Signed)
Patient called a second time with no answer. 

## 2022-02-14 DIAGNOSIS — Z419 Encounter for procedure for purposes other than remedying health state, unspecified: Secondary | ICD-10-CM | POA: Diagnosis not present

## 2022-02-21 ENCOUNTER — Emergency Department (HOSPITAL_BASED_OUTPATIENT_CLINIC_OR_DEPARTMENT_OTHER): Payer: Medicaid Other | Admitting: Radiology

## 2022-02-21 ENCOUNTER — Other Ambulatory Visit: Payer: Self-pay

## 2022-02-21 ENCOUNTER — Encounter (HOSPITAL_BASED_OUTPATIENT_CLINIC_OR_DEPARTMENT_OTHER): Payer: Self-pay

## 2022-02-21 ENCOUNTER — Emergency Department (HOSPITAL_BASED_OUTPATIENT_CLINIC_OR_DEPARTMENT_OTHER)
Admission: EM | Admit: 2022-02-21 | Discharge: 2022-02-22 | Disposition: A | Discharge status: Home / Self Care | Payer: Medicaid Other | Attending: Emergency Medicine | Admitting: Emergency Medicine

## 2022-02-21 DIAGNOSIS — M25461 Effusion, right knee: Secondary | ICD-10-CM | POA: Insufficient documentation

## 2022-02-21 DIAGNOSIS — Y9301 Activity, walking, marching and hiking: Secondary | ICD-10-CM | POA: Insufficient documentation

## 2022-02-21 DIAGNOSIS — Y92009 Unspecified place in unspecified non-institutional (private) residence as the place of occurrence of the external cause: Secondary | ICD-10-CM | POA: Insufficient documentation

## 2022-02-21 DIAGNOSIS — S8391XA Sprain of unspecified site of right knee, initial encounter: Secondary | ICD-10-CM | POA: Diagnosis not present

## 2022-02-21 DIAGNOSIS — W228XXA Striking against or struck by other objects, initial encounter: Secondary | ICD-10-CM | POA: Diagnosis not present

## 2022-02-21 DIAGNOSIS — S8991XA Unspecified injury of right lower leg, initial encounter: Secondary | ICD-10-CM | POA: Diagnosis present

## 2022-02-21 DIAGNOSIS — M25561 Pain in right knee: Secondary | ICD-10-CM | POA: Diagnosis not present

## 2022-02-21 NOTE — ED Triage Notes (Signed)
Pt arrives POV from home.  Says she hit her right knee on the edge of her couch, she says her knee popped out and popped back in.  She has had knee pain since.  Pt ambulatory to triage, in NAD.

## 2022-02-22 NOTE — ED Provider Notes (Signed)
[facility] EMERGENCY DEPT Provider Note   CSN: [id number] Arrival date & time: 02/21/22  1945     History  Chief Complaint  Patient presents with   Knee Pain    Kristin Hopkins is a 22 y.o. female.  Patient is a 22 year old female presenting with complaints of a right knee injury.  She states she was walking in her home yesterday when she struck her knee on a couch and her "kneecap popped out".  She has been having pain, swelling, and discomfort since.  It has improved somewhat with icing, but decided to have it checked out this evening.  She denies prior surgery or other significant knee issues.  The history is provided by the patient.       Home Medications Prior to Admission medications   Medication Sig Start Date End Date Taking? Authorizing Provider  ferrous sulfate (FERROUSUL) 325 (65 FE) MG tablet Take 1 tablet (325 mg total) by mouth daily with breakfast. 07/17/21   Rubie Maid, MD  hydrocortisone cream 0.5 % Apply 1 application. topically 2 (two) times daily. Apply topically twice a day for up to a week at affected area 07/16/21   Rubie Maid, MD  metroNIDAZOLE (FLAGYL) 500 MG tablet Take 1 tablet (500 mg total) by mouth 2 (two) times daily. 07/21/21   Rubie Maid, MD      Allergies    Patient has no known allergies.    Review of Systems   Review of Systems  All other systems reviewed and are negative.   Physical Exam Updated Vital Signs BP (!) 139/94 (BP Location: Right Arm)   Pulse 98   Temp 98.7 F (37.1 C) (Oral)   Resp 16   Ht 5\' 6"  (1.676 m)   Wt 70.3 kg   LMP 01/31/2022 (Exact Date)   SpO2 100%   BMI 25.02 kg/m  Physical Exam Vitals and nursing note reviewed.  Constitutional:      General: She is not in acute distress.    Appearance: Normal appearance. She is not ill-appearing.  HENT:     Head: Normocephalic and atraumatic.  Pulmonary:     Effort: Pulmonary effort is normal.  Musculoskeletal:     Comments: The right knee  has perhaps a small effusion noted.  There is tenderness to palpation over the medial aspect of the knee.  She has good range of motion with no crepitus.  Anterior and posterior drawer tests are negative and there is no laxity with varus or valgus stress.  Skin:    General: Skin is warm and dry.  Neurological:     Mental Status: She is alert.     ED Results / Procedures / Treatments   Labs (all labs ordered are listed, but only abnormal results are displayed) Labs Reviewed - No data to display  EKG None  Radiology DG Knee Complete 4 Views Right  Result Date: 02/21/2022 CLINICAL DATA:  Knee pain EXAM: RIGHT KNEE - COMPLETE 4+ VIEW COMPARISON:  None Available. FINDINGS: No evidence of fracture, dislocation, or joint effusion. No evidence of arthropathy or other focal bone abnormality. Soft tissues are unremarkable. IMPRESSION: Negative. Electronically Signed   By: Ronney Asters M.D.   On: 02/21/2022 20:43    Procedures Procedures    Medications Ordered in ED Medications - No data to display  ED Course/ Medical Decision Making/ A&P  X-rays negative for fracture.  There is no instability of the knee.  I suspect an MCL sprain.  This will be  treated with rest, ice, elevation, compression, and follow-up as needed if not improving.  Final Clinical Impression(s) / ED Diagnoses Final diagnoses:  None    Rx / DC Orders ED Discharge Orders     None         Geoffery Lyons, MD 02/22/22 0009

## 2022-02-22 NOTE — Discharge Instructions (Signed)
Wear Ace bandage for comfort and support.  Ice for 20 minutes every 2 hours while awake for the next 2 days.  Take ibuprofen 600 mg every 6 hours as needed for pain.  Follow-up with primary doctor if not improving in the next week.

## 2022-02-22 NOTE — ED Notes (Signed)
Pt verbalized understanding of d/c instructions, meds, and followup care. Denies questions. VSS, no distress noted. Steady gait to exit with all belongings.  ?

## 2022-03-17 DIAGNOSIS — Z419 Encounter for procedure for purposes other than remedying health state, unspecified: Secondary | ICD-10-CM | POA: Diagnosis not present

## 2022-03-29 ENCOUNTER — Emergency Department (HOSPITAL_BASED_OUTPATIENT_CLINIC_OR_DEPARTMENT_OTHER)
Admission: EM | Admit: 2022-03-29 | Discharge: 2022-03-29 | Disposition: A | Discharge status: Home / Self Care | Payer: PRIVATE HEALTH INSURANCE | Attending: Emergency Medicine | Admitting: Emergency Medicine

## 2022-03-29 ENCOUNTER — Encounter (HOSPITAL_BASED_OUTPATIENT_CLINIC_OR_DEPARTMENT_OTHER): Payer: Self-pay

## 2022-03-29 ENCOUNTER — Other Ambulatory Visit: Payer: Self-pay

## 2022-03-29 DIAGNOSIS — R21 Rash and other nonspecific skin eruption: Secondary | ICD-10-CM | POA: Insufficient documentation

## 2022-03-29 MED ORDER — MUPIROCIN CALCIUM 2 % EX CREA: 1.0000 | TOPICAL_CREAM | Freq: Two times a day (BID) | CUTANEOUS | 0 refills | Status: AC

## 2022-03-29 NOTE — ED Provider Notes (Signed)
[facility] Provider Note   CSN: [id number] Arrival date & time: 03/29/22  1451     History  Chief Complaint  Patient presents with   Allergic Reaction    Kristin Hopkins is a 22 y.o. female.   Allergic Reaction   22 year old female presents emergency department with complaints of rash.  Patient states that small rash had been present on her right lower face since late January.  Said the area was initially very itchy in nature.  States she went to dermatology outpatient who prescribed oral terbinafine which has not helped symptoms.  She states the area has continued to be pruritic in nature but is also noticed some yellow discharge from the area over the past 3 days or so.  Denies fever, chills, difficulty breathing/swallowing.  She has tried no topical medications.  Past medical history significant for anemia, eczema  Home Medications Prior to Admission medications   Medication Sig Start Date End Date Taking? Authorizing Provider  mupirocin cream (BACTROBAN) 2 % Apply 1 Application topically 2 (two) times daily. 03/29/22  Yes Dion Saucier A, PA  ferrous sulfate (FERROUSUL) 325 (65 FE) MG tablet Take 1 tablet (325 mg total) by mouth daily with breakfast. 07/17/21   Rubie Maid, MD  hydrocortisone cream 0.5 % Apply 1 application. topically 2 (two) times daily. Apply topically twice a day for up to a week at affected area 07/16/21   Rubie Maid, MD  metroNIDAZOLE (FLAGYL) 500 MG tablet Take 1 tablet (500 mg total) by mouth 2 (two) times daily. 07/21/21   Rubie Maid, MD      Allergies    Patient has no known allergies.    Review of Systems   Review of Systems  All other systems reviewed and are negative.   Physical Exam Updated Vital Signs BP 129/72 (BP Location: Right Arm)   Pulse 91   Temp 98.7 F (37.1 C)   Resp 15   SpO2 100%  Physical Exam Vitals and nursing note reviewed.  Constitutional:      General: She is not  in acute distress.    Appearance: She is well-developed.  HENT:     Head: Normocephalic and atraumatic.  Eyes:     Conjunctiva/sclera: Conjunctivae normal.  Cardiovascular:     Rate and Rhythm: Normal rate and regular rhythm.     Heart sounds: No murmur heard. Pulmonary:     Effort: Pulmonary effort is normal. No respiratory distress.     Breath sounds: Normal breath sounds. No stridor. No wheezing, rhonchi or rales.  Abdominal:     Palpations: Abdomen is soft.     Tenderness: There is no abdominal tenderness.  Musculoskeletal:        General: No swelling.     Cervical back: Neck supple.  Skin:    General: Skin is warm and dry.     Capillary Refill: Capillary refill takes less than 2 seconds.     Comments: Patient with rash noted along right lateral mandibular area.  Area with honey crusted lesions with active serous yellow-colored discharge noted.  No satellite lesions appreciated.  No obvious extending erythema, indurated skin, palpable fluctuance.  Neurological:     Mental Status: She is alert.  Psychiatric:        Mood and Affect: Mood normal.     ED Results / Procedures / Treatments   Labs (all labs ordered are listed, but only abnormal results are displayed) Labs Reviewed - No data to  display  EKG None  Radiology No results found.  Procedures Procedures    Medications Ordered in ED Medications - No data to display  ED Course/ Medical Decision Making/ A&P                             Medical Decision Making Risk Prescription drug management.   This patient presents to the ED for concern of rash, this involves an extensive number of treatment options, and is a complaint that carries with it a high risk of complications and morbidity.  The differential diagnosis includes eczema, impetigo, tinea corporis, erysipelas, cellulitis, necrotizing fasciitis, dermatitis   Co morbidities that complicate the patient evaluation  See HPI   Additional history  obtained:  Additional history obtained from EMR External records from outside source obtained and reviewed including hospital records   Lab Tests:  N/a   Imaging Studies ordered:  N/a   Cardiac Monitoring: / EKG:  The patient was maintained on a cardiac monitor.  I personally viewed and interpreted the cardiac monitored which showed an underlying rhythm of: Sinus rhythm   Consultations Obtained:  N/a   Problem List / ED Course / Critical interventions / Medication management  Rash Reevaluation of the patient showed that the patient stayed the same I have reviewed the patients home medicines and have made adjustments as needed   Social Determinants of Health:  Denies tobacco, licit drug use   Test / Admission - Considered:  Rash Vitals signs  within normal range and stable throughout visit. Patient's with evidence of rash most consistent with impetigo.  Unsure of what underlying rash initially began appearing but patient with honey crusted lesion with yellow serous discharge.  Will treat with topical antibiotic therapy.  No indication of anaphylaxis, angioedema.  Patient has upcoming appointment with dermatology outpatient next week for reassessment.  Patient recommended strict adherence to antibiotic cream.  No indication for oral antibiotics given lack of cellulitic skin changes.  Treatment plan discussed at length with patient and she acknowledged understanding was agreeable to said plan. Worrisome signs and symptoms were discussed with the patient, and the patient acknowledged understanding to return to the ED if noticed. Patient was stable upon discharge.          Final Clinical Impression(s) / ED Diagnoses Final diagnoses:  Rash    Rx / DC Orders ED Discharge Orders          Ordered    mupirocin cream (BACTROBAN) 2 %  2 times daily        03/29/22 1553              Wilnette Kales, [place] 03/29/22 1826    Regan Lemming, MD 03/29/22  1918

## 2022-03-29 NOTE — Discharge Instructions (Addendum)
Note the workup today was overall reassuring.  As discussed, rash most likely secondary to bacterial infection.  We withhold from treatment of the underlying eczematous rash until bacterial infection has been treated.  Use cream twice daily for duration of rash.  Recommend follow-up with dermatology outpatient for your upcoming appointment.  Please do not hesitate to return to emergency department for worrisome signs and symptoms we discussed become apparent.

## 2022-03-29 NOTE — ED Triage Notes (Addendum)
Pt presents POV from home, ambulatory   Pt reports she noticed a possible spider bite 03/15/22. Pt went to her dermatologist thinking it was her eczema, he prescribed her some medication, pt denies any relief.  Pt presents today with increase swelling, more to Left side. Pt reports her bite was on the Right side. Pt reports new onset of yellow drainage 3 days ago  Pt tearful in triage

## 2022-04-08 IMAGING — CR DG CHEST 2V
2 series · 2 of 2 positions shown · non-contrast
Comparison: None.

CLINICAL DATA: Fever.

EXAM:
CHEST - 2 VIEW

[chest pa]
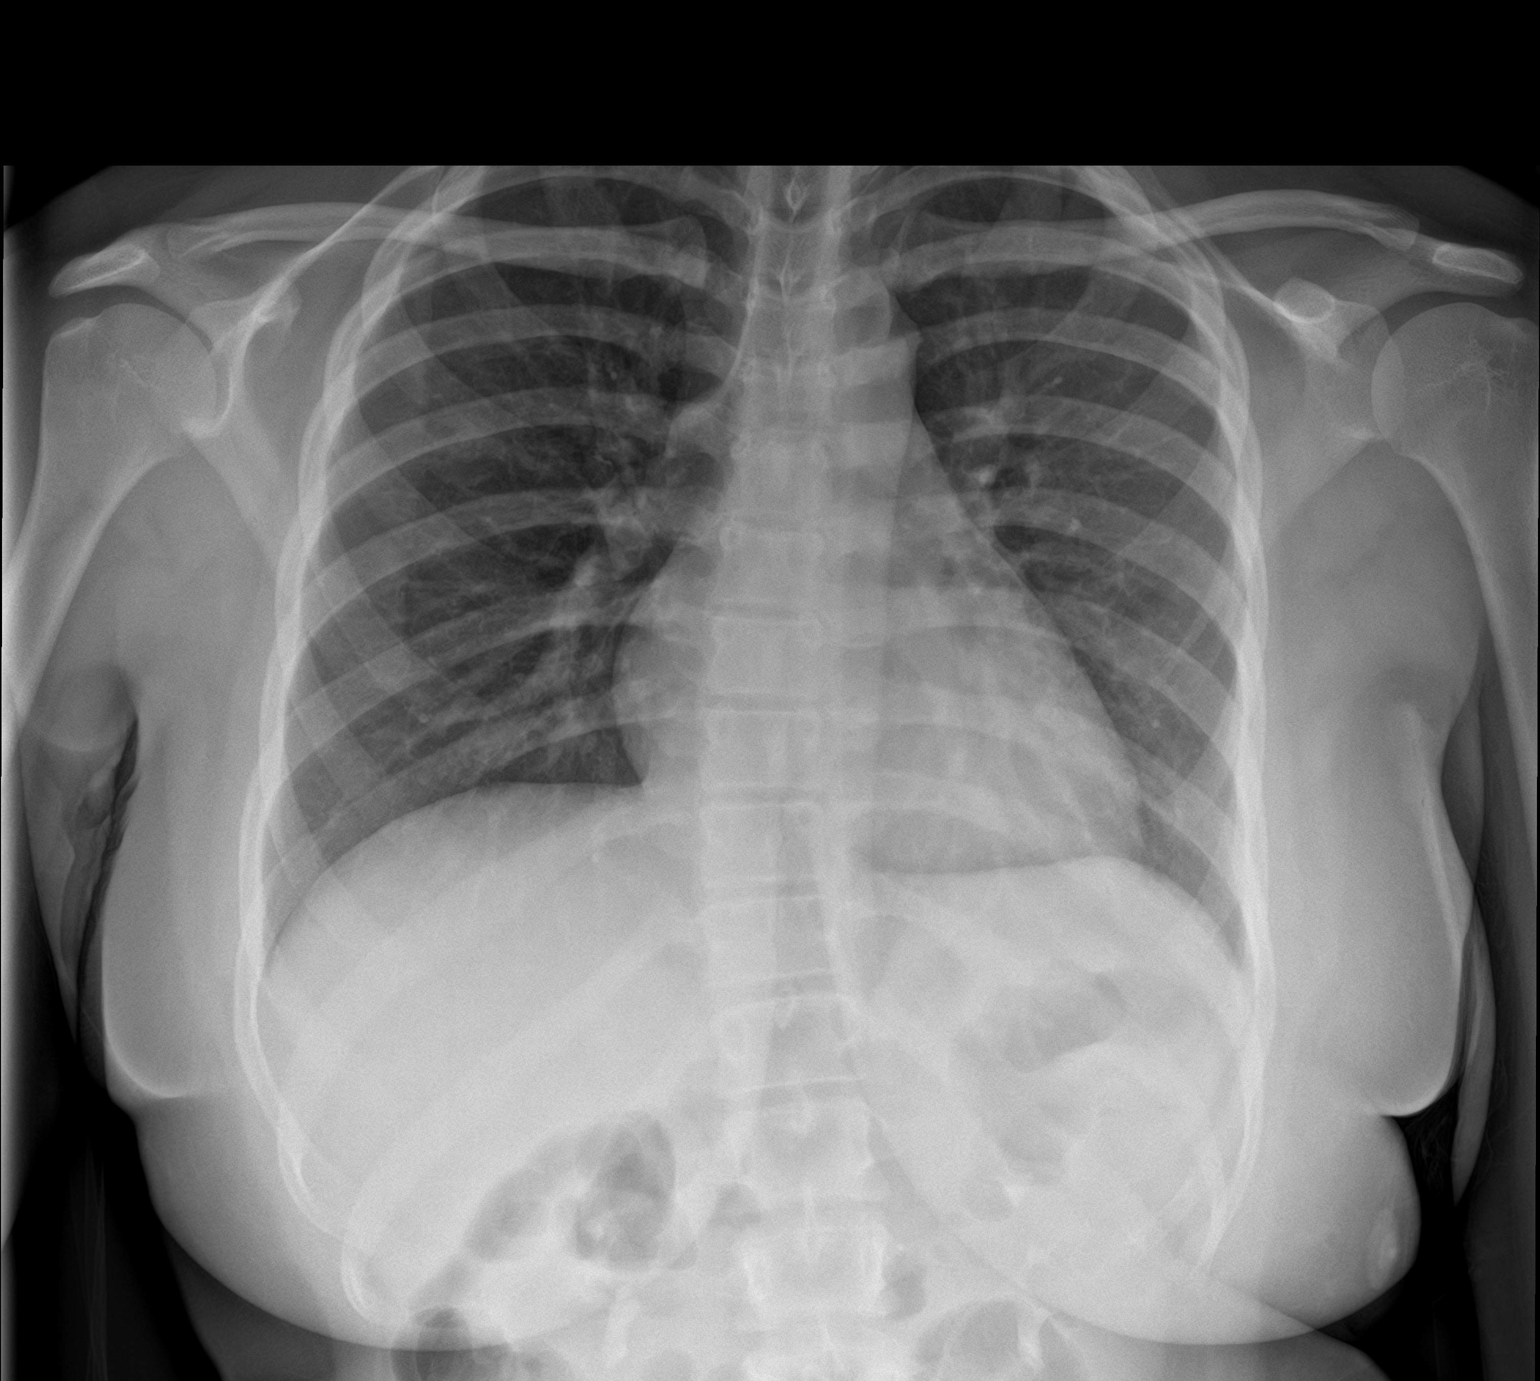

[chest lat]
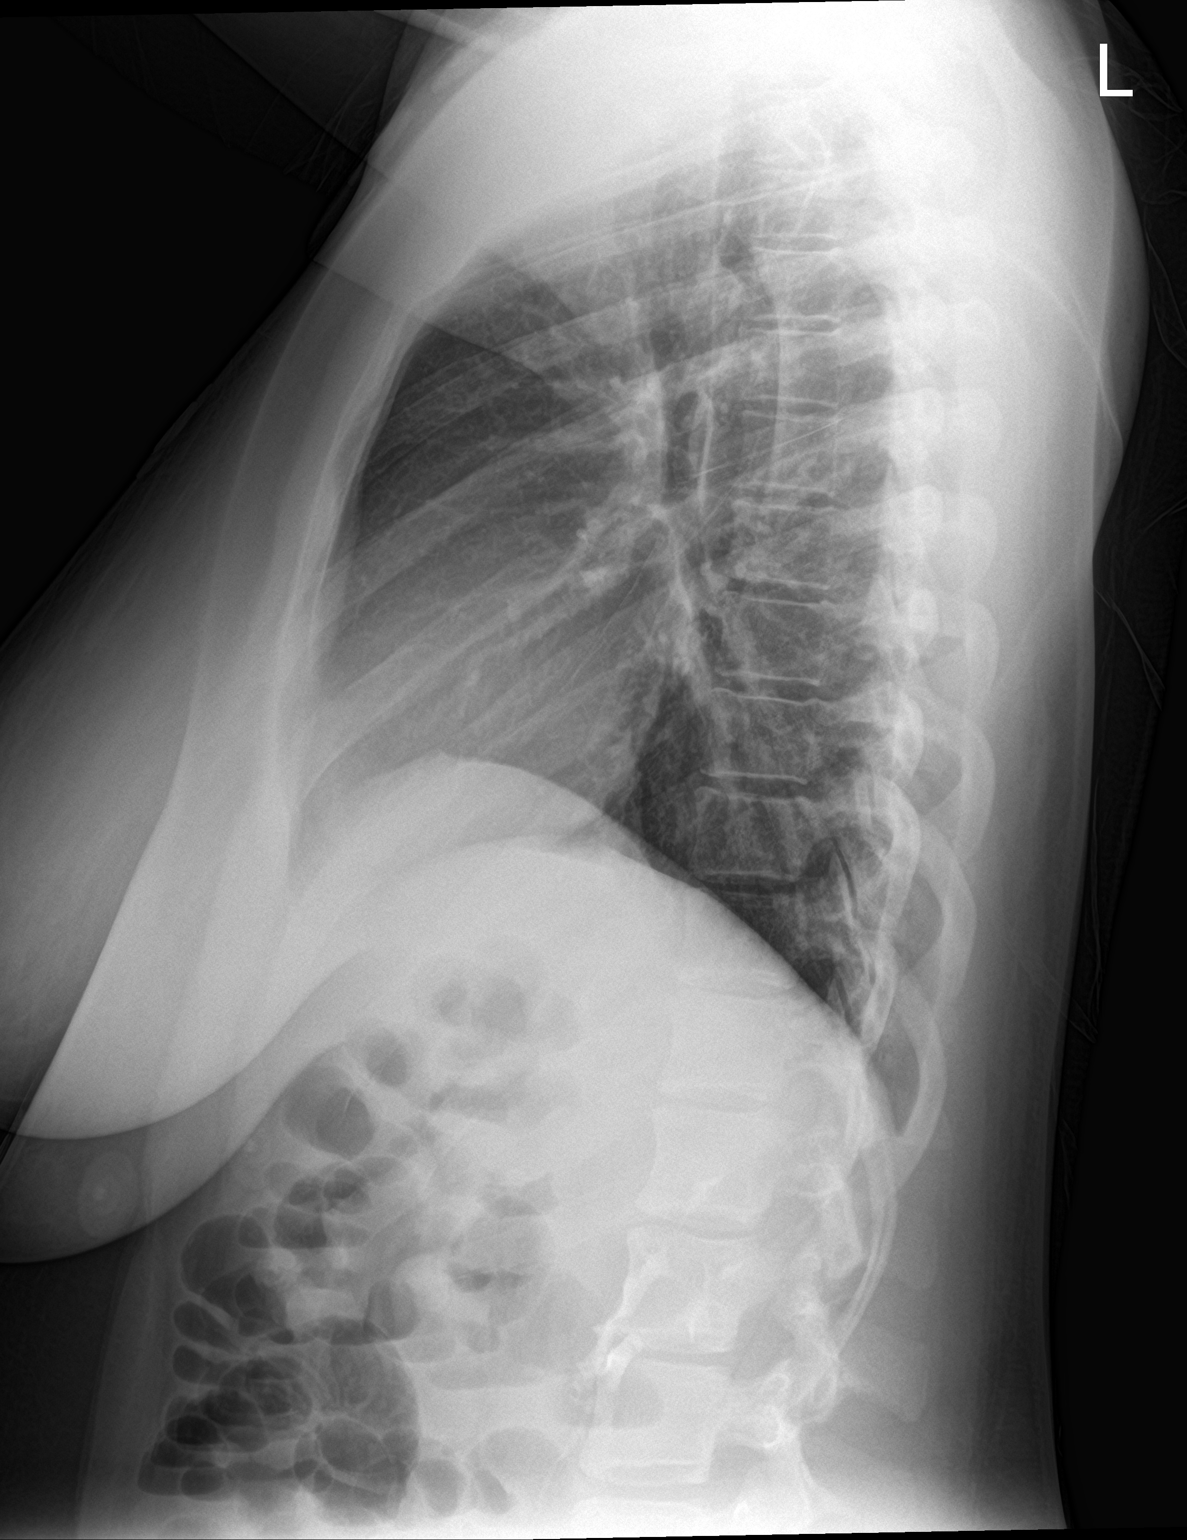

[2 of 2 positions shown; findings below may reference images not displayed]

FINDINGS: The heart size and mediastinal contours are within normal limits.
Both lungs are clear. The visualized skeletal structures are
unremarkable.
IMPRESSION: No active cardiopulmonary disease.

## 2022-04-15 DIAGNOSIS — Z419 Encounter for procedure for purposes other than remedying health state, unspecified: Secondary | ICD-10-CM | POA: Diagnosis not present

## 2022-05-04 IMAGING — US US PELVIS COMPLETE WITH TRANSVAGINAL
1 series · 13 of 25 positions shown · non-contrast
Comparison: Prior CT from 12/17/2019.

CLINICAL DATA: Initial evaluation for acute vaginal bleeding for 1
month.



[Series 1: us pelvis (transabdominal only) · 13 of 43 slices shown]
[im 1/43]
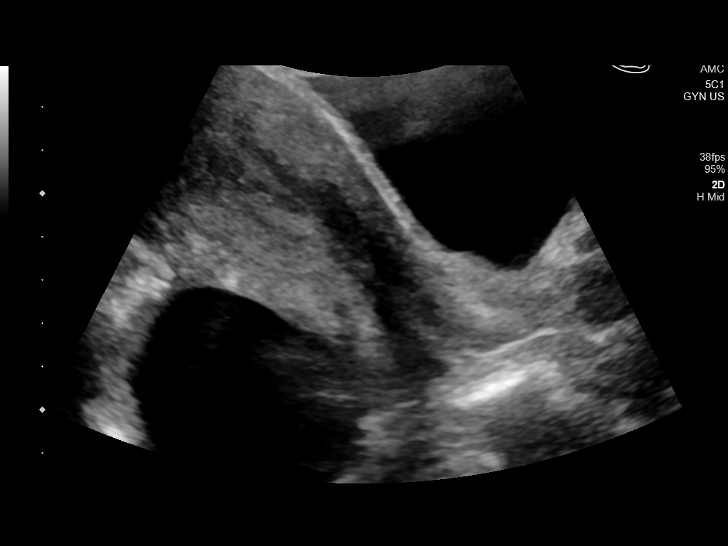
[im 4/43]
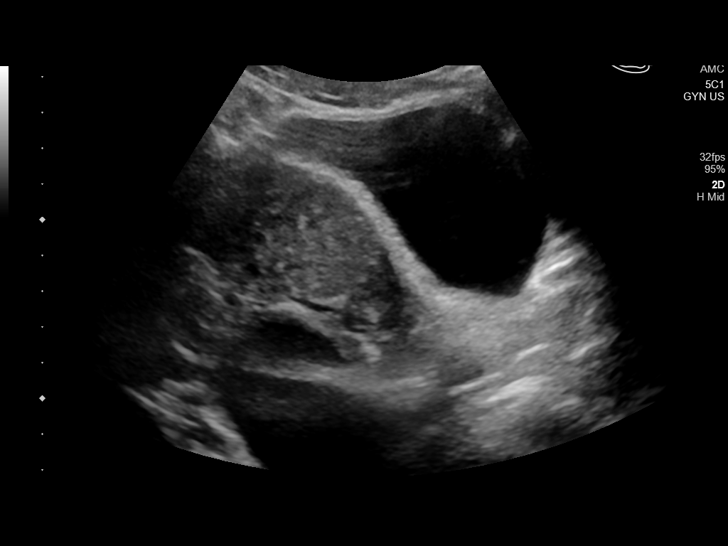
[im 8/43]
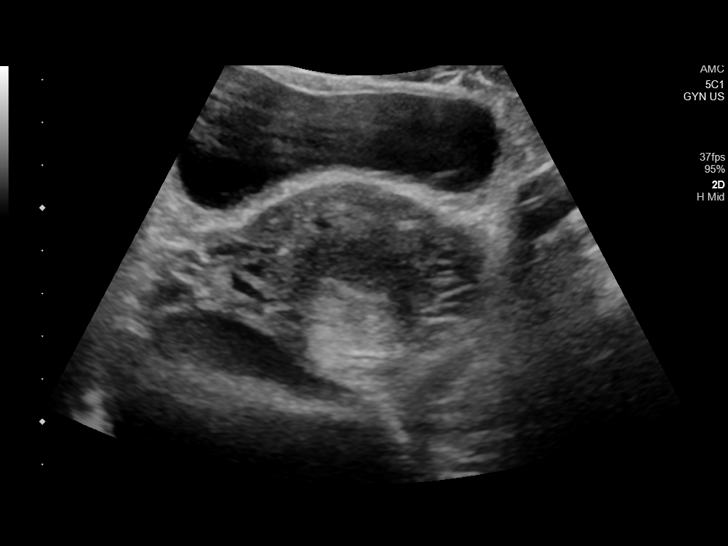
[im 11/43]
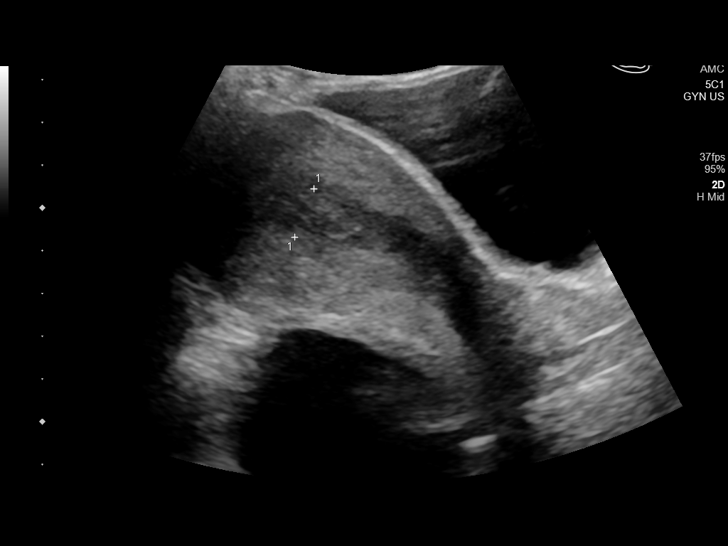
[im 15/43]
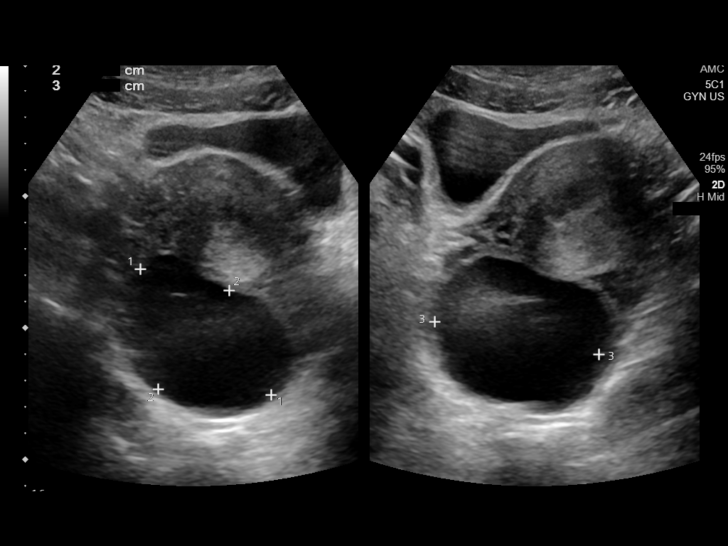
[im 18/43]
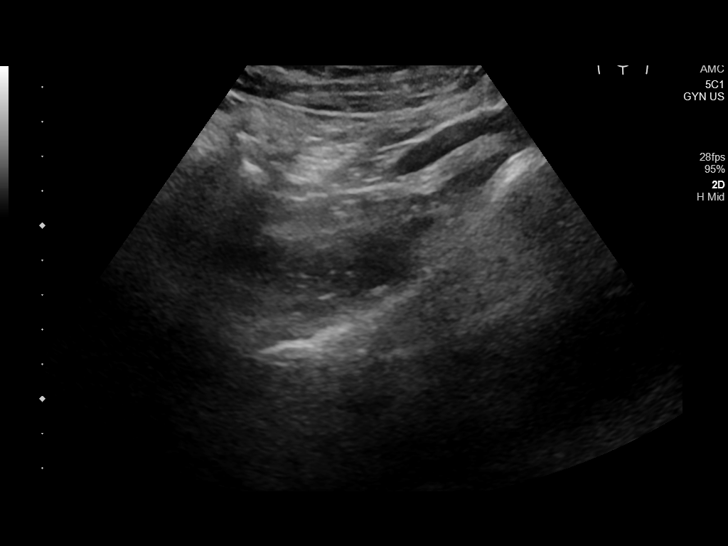
[im 22/43]
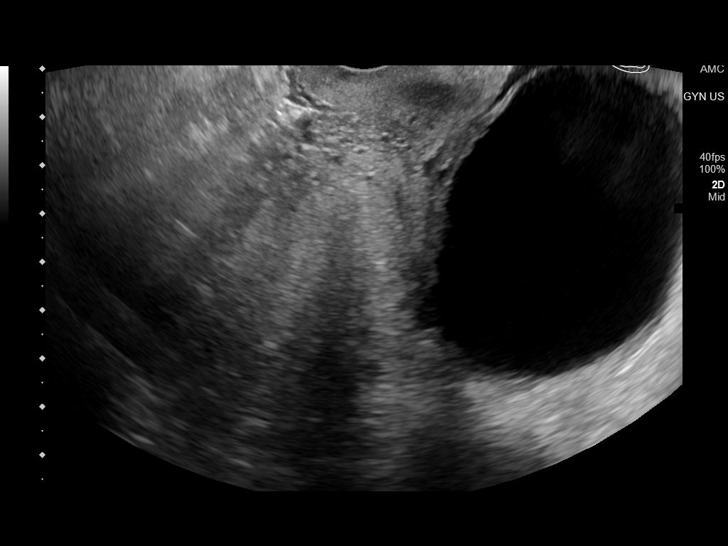
[im 25/43]
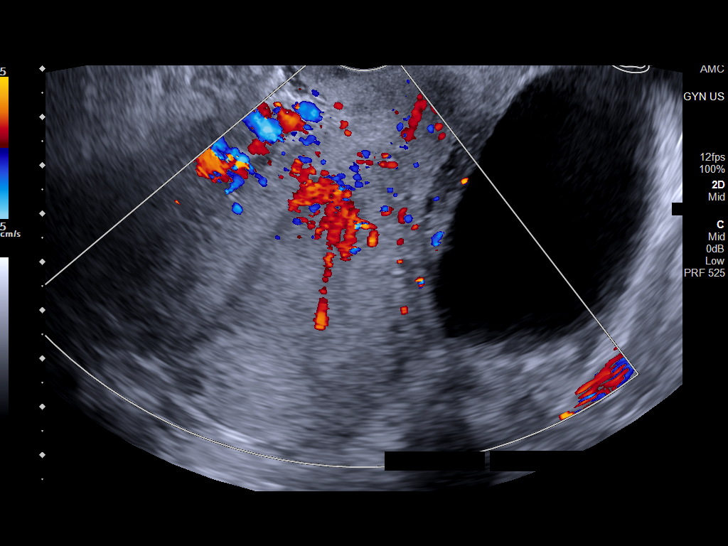
[im 29/43]
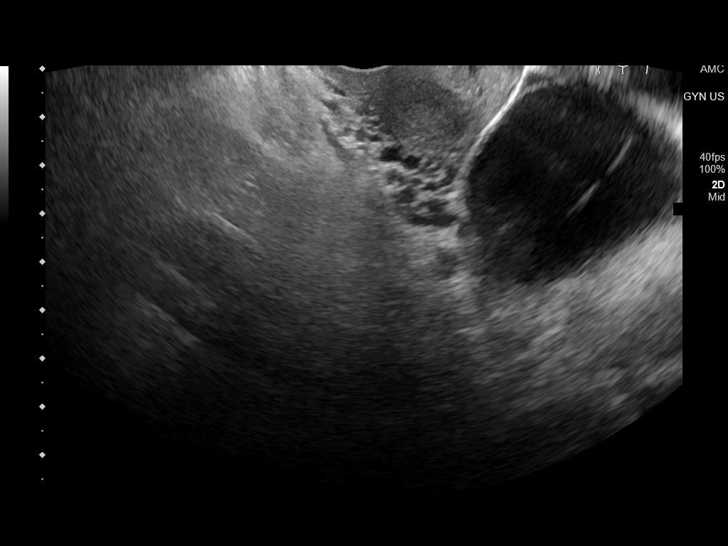
[im 32/43]
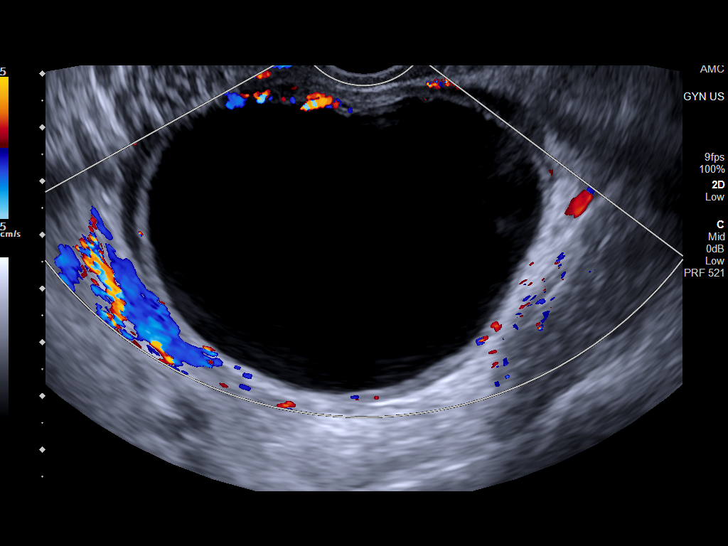
[im 36/43]
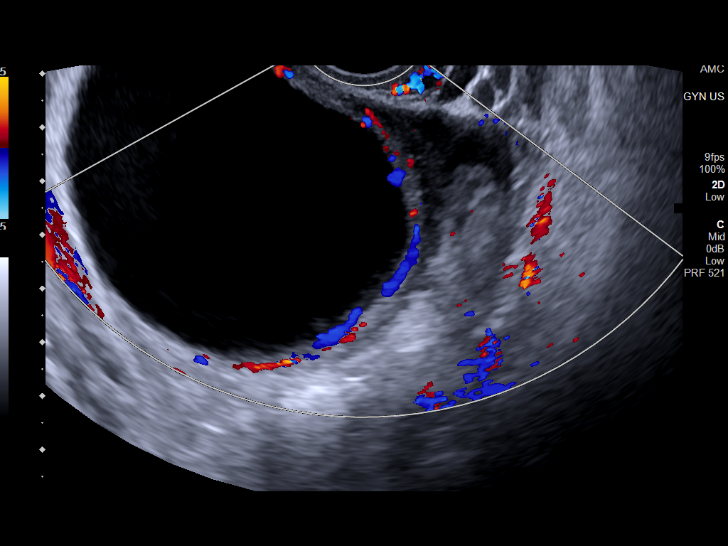
[im 39/43]
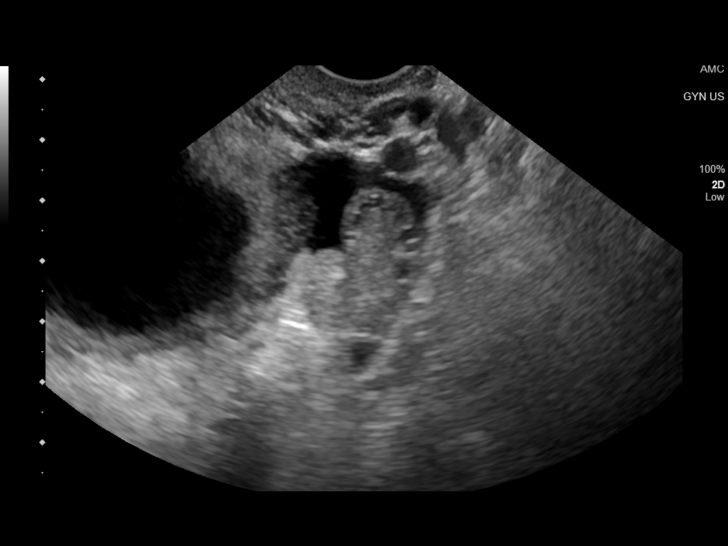
[im 43/43]
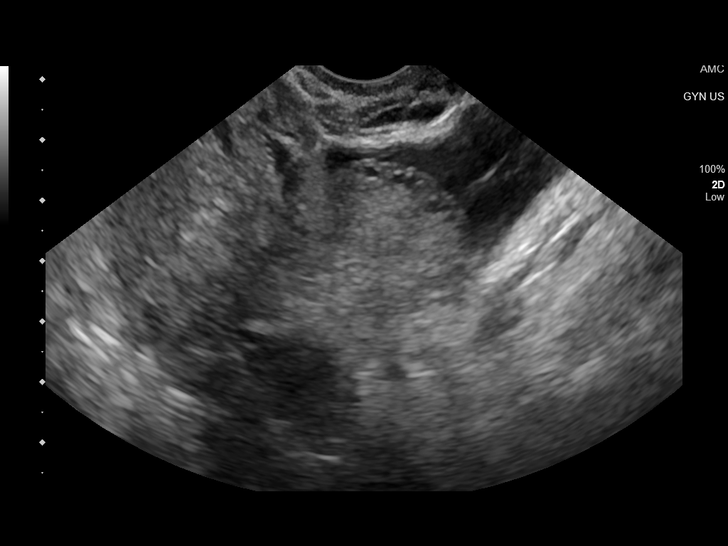

[13 of 25 positions shown; findings below may reference images not displayed]

FINDINGS: Uterus

Measurements: 9.2 x 4.9 x 4.8 cm = volume: 112 mL. Uterus is
anteverted. No discrete fibroid or other mass.

Endometrium

Thickness: 16 mm.  No focal abnormality visualized.

Right ovary

Measurements: 7.9 x 6.0 x 7.8 cm = volume: 195 mL. 7.6 cm simple
anechoic unilocular cyst seen arising from the right ovary. No
significant internal complexity. No vascularity or solid component.

Left ovary

Measurements: 2.5 x 1.3 x 3.1 cm = volume: 5.3 mL. Normal
appearance/no adnexal mass.

Other findings

Small volume free fluid present within the pelvis.
IMPRESSION: 1. Endometrial stripe measures 16 mm in thickness. If bleeding
remains unresponsive to hormonal or medical therapy, focal lesion
work-up with sonohysterogram should be considered. Endometrial
biopsy should also be considered in pre-menopausal patients at high
risk for endometrial carcinoma. (Ref: Radiological Reasoning:
Algorithmic Workup of Abnormal Vaginal Bleeding with Endovaginal
Sonography and Sonohysterography. AJR 7441; 191:S68-73).
2. 7.6 cm simple right ovarian cyst. Recommend follow-up US in 3-6
months. Note: This recommendation does not apply to premenarchal
patients or to those with increased risk (genetic, family history,
elevated tumor markers or other high-risk factors) of ovarian
3. Small volume free fluid within the pelvis, presumably
physiologic.

## 2022-05-16 DIAGNOSIS — Z419 Encounter for procedure for purposes other than remedying health state, unspecified: Secondary | ICD-10-CM | POA: Diagnosis not present

## 2022-06-15 DIAGNOSIS — Z419 Encounter for procedure for purposes other than remedying health state, unspecified: Secondary | ICD-10-CM | POA: Diagnosis not present

## 2022-07-16 DIAGNOSIS — Z419 Encounter for procedure for purposes other than remedying health state, unspecified: Secondary | ICD-10-CM | POA: Diagnosis not present

## 2022-08-13 ENCOUNTER — Emergency Department (HOSPITAL_COMMUNITY)
Admission: EM | Admit: 2022-08-13 | Discharge: 2022-08-14 | Disposition: A | Discharge status: Home / Self Care | Payer: PRIVATE HEALTH INSURANCE | Attending: Emergency Medicine | Admitting: Emergency Medicine

## 2022-08-13 ENCOUNTER — Other Ambulatory Visit: Payer: Self-pay

## 2022-08-13 DIAGNOSIS — N898 Other specified noninflammatory disorders of vagina: Secondary | ICD-10-CM | POA: Insufficient documentation

## 2022-08-13 DIAGNOSIS — R1030 Lower abdominal pain, unspecified: Secondary | ICD-10-CM | POA: Diagnosis present

## 2022-08-13 DIAGNOSIS — M545 Low back pain, unspecified: Secondary | ICD-10-CM

## 2022-08-13 LAB — WET PREP, GENITAL
Clue Cells Wet Prep HPF POC: NONE SEEN
Sperm: NONE SEEN
Trich, Wet Prep: NONE SEEN
WBC, Wet Prep HPF POC: <10 (ref <10)
Yeast Wet Prep HPF POC: NONE SEEN

## 2022-08-13 LAB — URINALYSIS, ROUTINE W REFLEX MICROSCOPIC
Bacteria, UA: NONE SEEN
Bilirubin Urine: NEGATIVE
Glucose, UA: NEGATIVE mg/dL
Hgb urine dipstick: NEGATIVE
Ketones, ur: 5 mg/dL — AB
Nitrite: NEGATIVE
Protein, ur: 30 mg/dL — AB
Specific Gravity, Urine: 1.027 (ref 1.005–1.030)
pH: 6 (ref 5.0–8.0)

## 2022-08-13 LAB — PREGNANCY, URINE: Preg Test, Ur: NEGATIVE

## 2022-08-13 NOTE — ED Provider Notes (Signed)
[name] EMERGENCY DEPARTMENT AT [facility] Provider Note   CSN: [id number] Arrival date & time: 08/13/22  2126     History  Chief Complaint  Patient presents with   Back Pain    Kristin Hopkins is a 22 y.o. female.  Patient is a 22 year old female who presents with lower abdominal pain.  She says it is a crampy type pain that feels like after her menstrual cycle.  It radiates around to her back.  She is having some vaginal discharge also.  No urinary symptoms.  No change in stools.  No fevers.  She is sexually active.       Home Medications Prior to Admission medications   Medication Sig Start Date End Date Taking? Authorizing Provider  ferrous sulfate (FERROUSUL) 325 (65 FE) MG tablet Take 1 tablet (325 mg total) by mouth daily with breakfast. 07/17/21   Hildred Laser, MD  hydrocortisone cream 0.5 % Apply 1 application. topically 2 (two) times daily. Apply topically twice a day for up to a week at affected area 07/16/21   Hildred Laser, MD  metroNIDAZOLE (FLAGYL) 500 MG tablet Take 1 tablet (500 mg total) by mouth 2 (two) times daily. 07/21/21   Hildred Laser, MD  mupirocin cream (BACTROBAN) 2 % Apply 1 Application topically 2 (two) times daily. 03/29/22   Peter Garter, PA      Allergies    Patient has no known allergies.    Review of Systems   Review of Systems  Constitutional:  Negative for chills, diaphoresis, fatigue and fever.  HENT:  Negative for congestion, rhinorrhea and sneezing.   Eyes: Negative.   Respiratory:  Negative for cough, chest tightness and shortness of breath.   Cardiovascular:  Negative for chest pain and leg swelling.  Gastrointestinal:  Positive for abdominal pain. Negative for blood in stool, diarrhea, nausea and vomiting.  Genitourinary:  Positive for vaginal discharge. Negative for difficulty urinating, flank pain, frequency, hematuria and vaginal bleeding.  Musculoskeletal:  Negative for arthralgias and back pain.  Skin:   Negative for rash.  Neurological:  Negative for dizziness, speech difficulty, weakness, numbness and headaches.    Physical Exam Updated Vital Signs BP (!) 146/91 (BP Location: Right Arm)   Pulse 80   Temp 98.6 F (37 C) (Oral)   Resp 18   Ht 5\' 5"  (1.651 m)   Wt 70.3 kg   SpO2 100%   BMI 25.79 kg/m  Physical Exam Constitutional:      Appearance: She is well-developed.  HENT:     Head: Normocephalic and atraumatic.  Eyes:     Pupils: Pupils are equal, round, and reactive to light.  Cardiovascular:     Rate and Rhythm: Normal rate and regular rhythm.     Heart sounds: Normal heart sounds.  Pulmonary:     Effort: Pulmonary effort is normal. No respiratory distress.     Breath sounds: Normal breath sounds. No wheezing or rales.  Chest:     Chest wall: No tenderness.  Abdominal:     General: Bowel sounds are normal.     Palpations: Abdomen is soft.     Tenderness: There is no abdominal tenderness. There is no guarding or rebound.  Genitourinary:    Comments: Thick white discharge, no cervical motion tenderness, no adnexal tenderness Musculoskeletal:        General: Normal range of motion.     Cervical back: Normal range of motion and neck supple.  Lymphadenopathy:  Cervical: No cervical adenopathy.  Skin:    General: Skin is warm and dry.     Findings: No rash.  Neurological:     Mental Status: She is alert and oriented to person, place, and time.     ED Results / Procedures / Treatments   Labs (all labs ordered are listed, but only abnormal results are displayed) Labs Reviewed  WET PREP, GENITAL  PREGNANCY, URINE  URINALYSIS, ROUTINE W REFLEX MICROSCOPIC  RPR  HIV ANTIBODY (ROUTINE TESTING W REFLEX)  GC/CHLAMYDIA PROBE AMP ([place]) NOT AT [facility]    EKG None  Radiology No results found.  Procedures Procedures    Medications Ordered in ED Medications - No data to display  ED Course/ Medical Decision Making/ A&P                              Medical Decision Making Amount and/or Complexity of Data Reviewed Labs: ordered.   Patient is a 22 year old who presents with lower abdominal pain.  She does have some vaginal discharge on exam.  Her urine and vaginal swabs are pending.  Care turned over to Dr. Blinda Leatherwood.  Final Clinical Impression(s) / ED Diagnoses Final diagnoses:  None    Rx / DC Orders ED Discharge Orders     None         Rolan Bucco, MD 08/13/22 2305

## 2022-08-13 NOTE — ED Triage Notes (Signed)
Patient coming to ED for evaluation of pain in lower back.  States "it just been cramping and having shooting pains."  No reports of injury.  No numbness or tingling

## 2022-08-14 ENCOUNTER — Emergency Department (HOSPITAL_COMMUNITY): Payer: PRIVATE HEALTH INSURANCE

## 2022-08-14 DIAGNOSIS — R1030 Lower abdominal pain, unspecified: Secondary | ICD-10-CM | POA: Diagnosis not present

## 2022-08-14 LAB — RPR: RPR Ser Ql: NONREACTIVE

## 2022-08-14 LAB — HIV ANTIBODY (ROUTINE TESTING W REFLEX): HIV Screen 4th Generation wRfx: NONREACTIVE

## 2022-08-14 MED ORDER — METHOCARBAMOL 500 MG PO TABS: 500.0000 mg | ORAL_TABLET | Freq: Three times a day (TID) | ORAL | 0 refills | Status: AC | PRN

## 2022-08-14 MED ORDER — IBUPROFEN 800 MG PO TABS: 800.0000 mg | ORAL_TABLET | Freq: Four times a day (QID) | ORAL | 0 refills | Status: AC | PRN

## 2022-08-15 DIAGNOSIS — Z419 Encounter for procedure for purposes other than remedying health state, unspecified: Secondary | ICD-10-CM | POA: Diagnosis not present

## 2022-08-15 LAB — GC/CHLAMYDIA PROBE AMP (~~LOC~~) NOT AT ARMC
Chlamydia: NEGATIVE
Comment: NEGATIVE
Comment: NORMAL
Neisseria Gonorrhea: NEGATIVE

## 2022-09-15 DIAGNOSIS — Z419 Encounter for procedure for purposes other than remedying health state, unspecified: Secondary | ICD-10-CM | POA: Diagnosis not present

## 2022-10-16 DIAGNOSIS — Z419 Encounter for procedure for purposes other than remedying health state, unspecified: Secondary | ICD-10-CM | POA: Diagnosis not present

## 2022-11-15 DIAGNOSIS — Z419 Encounter for procedure for purposes other than remedying health state, unspecified: Secondary | ICD-10-CM | POA: Diagnosis not present

## 2022-12-16 DIAGNOSIS — Z419 Encounter for procedure for purposes other than remedying health state, unspecified: Secondary | ICD-10-CM | POA: Diagnosis not present

## 2023-01-15 DIAGNOSIS — Z419 Encounter for procedure for purposes other than remedying health state, unspecified: Secondary | ICD-10-CM | POA: Diagnosis not present

## 2023-02-15 DIAGNOSIS — Z419 Encounter for procedure for purposes other than remedying health state, unspecified: Secondary | ICD-10-CM | POA: Diagnosis not present

## 2023-03-18 DIAGNOSIS — Z419 Encounter for procedure for purposes other than remedying health state, unspecified: Secondary | ICD-10-CM | POA: Diagnosis not present

## 2023-04-15 DIAGNOSIS — Z419 Encounter for procedure for purposes other than remedying health state, unspecified: Secondary | ICD-10-CM | POA: Diagnosis not present

## 2023-05-27 DIAGNOSIS — Z419 Encounter for procedure for purposes other than remedying health state, unspecified: Secondary | ICD-10-CM | POA: Diagnosis not present

## 2023-06-26 DIAGNOSIS — Z419 Encounter for procedure for purposes other than remedying health state, unspecified: Secondary | ICD-10-CM | POA: Diagnosis not present

## 2023-07-27 DIAGNOSIS — Z419 Encounter for procedure for purposes other than remedying health state, unspecified: Secondary | ICD-10-CM | POA: Diagnosis not present

## 2023-08-26 DIAGNOSIS — Z419 Encounter for procedure for purposes other than remedying health state, unspecified: Secondary | ICD-10-CM | POA: Diagnosis not present

## 2023-08-29 ENCOUNTER — Telehealth: Payer: Self-pay

## 2023-08-29 NOTE — Telephone Encounter (Signed)
 Patient contacted office stating that she is joining the [employer] and needs Anemia to be taken off her PMH. Patient states that our office had diagnosed her with anemia and she states that she is requesting a letter stating that anemia diagnosis was written in error and that she was never anemic. Patient is requesting that Zelda approve letter, she has been advised that Zelda will not be back in office until 09/11/23. Patient last had lab checked by annie on 08/05/2020. Please advise.

## 2023-08-31 NOTE — Progress Notes (Signed)
    GYNECOLOGY PROGRESS NOTE  Subjective:    Patient ID: Kristin Hopkins, female    DOB: 03/12/2000, 23 y.o.   MRN: [id number]  HPI  Patient is a 23 y.o. [id number] female who presents for follow up on anemia. She had some abnormal uterine bleeding in 2021 that caused her to be anemic.  Iron was 8.8 on 07/16/2021.  She has been taking iron 4 times a day with orange juice for the last 3-4 months to get her iron up.  She is joining the [employer] and they need proof that she is no longer anemic in order to enroll.   Review of Systems Pertinent items are noted in HPI.   Objective:   Blood pressure 127/78, pulse 76, height 5' 6 (1.676 m), weight 172 lb 4.8 oz (78.2 kg), last menstrual period 08/14/2023. Body mass index is 27.81 kg/m. General appearance: alert and cooperative   Assessment:   1. Anemia, unspecified type      Plan:   -Here for CBC draw. Reviewed comfort measures for constipation   Damien Parsley, CNM [facility]

## 2023-09-04 ENCOUNTER — Encounter: Payer: Self-pay | Admitting: Certified Nurse Midwife

## 2023-09-04 ENCOUNTER — Ambulatory Visit (INDEPENDENT_AMBULATORY_CARE_PROVIDER_SITE_OTHER): Payer: PRIVATE HEALTH INSURANCE | Admitting: Certified Nurse Midwife

## 2023-09-04 VITALS — BP 127/78 | HR 76 | Ht 66.0 in | Wt 172.3 lb

## 2023-09-04 DIAGNOSIS — D649 Anemia, unspecified: Secondary | ICD-10-CM

## 2023-09-04 DIAGNOSIS — Z09 Encounter for follow-up examination after completed treatment for conditions other than malignant neoplasm: Secondary | ICD-10-CM

## 2023-09-04 NOTE — Telephone Encounter (Signed)
 Patient has been advised and states that she is in office now to get CBC redrawn. KW

## 2023-09-05 LAB — CBC
Hematocrit: 33.1 % — ABNORMAL LOW (ref 34.0–46.6)
Hemoglobin: 9.9 g/dL — ABNORMAL LOW (ref 11.1–15.9)
MCH: 22.5 pg — ABNORMAL LOW (ref 26.6–33.0)
MCHC: 29.9 g/dL — ABNORMAL LOW (ref 31.5–35.7)
MCV: 75 fL — ABNORMAL LOW (ref 79–97)
Platelets: 339 x10E3/uL (ref 150–450)
RBC: 4.4 x10E6/uL (ref 3.77–5.28)
RDW: 20.9 % — ABNORMAL HIGH (ref 11.7–15.4)
WBC: 4.6 x10E3/uL (ref 3.4–10.8)

## 2023-09-06 ENCOUNTER — Ambulatory Visit: Payer: Self-pay | Admitting: Certified Nurse Midwife

## 2023-09-06 ENCOUNTER — Other Ambulatory Visit: Payer: Self-pay | Admitting: Certified Nurse Midwife

## 2023-09-06 DIAGNOSIS — D649 Anemia, unspecified: Secondary | ICD-10-CM

## 2023-09-26 DIAGNOSIS — Z419 Encounter for procedure for purposes other than remedying health state, unspecified: Secondary | ICD-10-CM | POA: Diagnosis not present

## 2023-10-27 DIAGNOSIS — Z419 Encounter for procedure for purposes other than remedying health state, unspecified: Secondary | ICD-10-CM | POA: Diagnosis not present

## 2023-12-27 DIAGNOSIS — Z419 Encounter for procedure for purposes other than remedying health state, unspecified: Secondary | ICD-10-CM | POA: Diagnosis not present
